# Patient Record
Sex: Female | Born: 1980 | Race: White | Hispanic: No | Marital: Married | Smoking: Never smoker
Health system: Southern US, Community
[De-identification: ages and names within clinical notes are randomized; demographics above are authoritative.]

## PROBLEM LIST (undated history)

## (undated) DIAGNOSIS — IMO0002 Reserved for concepts with insufficient information to code with codable children: Secondary | ICD-10-CM

## (undated) DIAGNOSIS — Z8742 Personal history of other diseases of the female genital tract: Secondary | ICD-10-CM

## (undated) DIAGNOSIS — B977 Papillomavirus as the cause of diseases classified elsewhere: Secondary | ICD-10-CM

## (undated) DIAGNOSIS — R87619 Unspecified abnormal cytological findings in specimens from cervix uteri: Secondary | ICD-10-CM

## (undated) DIAGNOSIS — Z86018 Personal history of other benign neoplasm: Secondary | ICD-10-CM

## (undated) DIAGNOSIS — Z86718 Personal history of other venous thrombosis and embolism: Secondary | ICD-10-CM

## (undated) DIAGNOSIS — I8390 Asymptomatic varicose veins of unspecified lower extremity: Secondary | ICD-10-CM

## (undated) HISTORY — DX: Asymptomatic varicose veins of unspecified lower extremity: I83.90

## (undated) HISTORY — PX: BUNIONECTOMY: SHX129

## (undated) HISTORY — DX: Unspecified abnormal cytological findings in specimens from cervix uteri: R87.619

## (undated) HISTORY — DX: Reserved for concepts with insufficient information to code with codable children: IMO0002

## (undated) HISTORY — PX: OTHER SURGICAL HISTORY: SHX169

## (undated) HISTORY — PX: VARICOSE VEIN SURGERY: SHX832

## (undated) HISTORY — DX: Personal history of other venous thrombosis and embolism: Z86.718

## (undated) HISTORY — DX: Personal history of other benign neoplasm: Z86.018

## (undated) HISTORY — DX: Personal history of other diseases of the female genital tract: Z87.42

## (undated) HISTORY — DX: Papillomavirus as the cause of diseases classified elsewhere: B97.7

---

## 2012-10-15 ENCOUNTER — Ambulatory Visit: Payer: BC Managed Care – PPO | Admitting: Physician Assistant

## 2012-10-15 ENCOUNTER — Encounter: Payer: Self-pay | Admitting: Physician Assistant

## 2012-10-15 VITALS — BP 124/79 | HR 76 | Ht 69.0 in | Wt 257.0 lb

## 2012-10-15 DIAGNOSIS — R635 Abnormal weight gain: Secondary | ICD-10-CM

## 2012-10-15 DIAGNOSIS — L680 Hirsutism: Secondary | ICD-10-CM

## 2012-10-15 DIAGNOSIS — N926 Irregular menstruation, unspecified: Secondary | ICD-10-CM

## 2012-10-15 DIAGNOSIS — N921 Excessive and frequent menstruation with irregular cycle: Secondary | ICD-10-CM

## 2012-10-15 DIAGNOSIS — L709 Acne, unspecified: Secondary | ICD-10-CM

## 2012-10-15 MED ORDER — ADAPALENE-BENZOYL PEROXIDE 0.1-2.5 % EX GEL: CUTANEOUS | Status: DC

## 2012-10-15 MED ORDER — LORCASERIN HCL 10 MG PO TABS: 10.0000 mg | ORAL_TABLET | Freq: Two times a day (BID) | ORAL | Status: DC

## 2012-10-15 NOTE — Progress Notes (Signed)
  Subjective:    Patient ID: Melanie Perez, female    DOB: [birth year], 32 y.o.   MRN: [id number]  HPI Patient is a 32 yo female who presents to the clinic to establish care. She is very frustrated with how she feels and her current health condition. She is not on any meds at this point. She is aware she needs tdap. She does have a paragard IUD. She has 3 children and done with family planning. Date of last peroid was 09/27/12. She has had battle with weight gain for many years but recently became much harder. 1 year ago she lost 45 lbs with Dr. Nikki Dom weight loss clinic but quickly gained it back. At the beginning of this year she started an intense work out program with Systems analyst and in the first 90 days gained 15lbs and next 90 days 10 more lbs. She has changed her diet and still gaining weight. She is very tired, no sex life, having night sweats, irregular periods. Per pt she feels like she is going through menopause. She bleeds in between periods. She has facial hair that she waxes off. Last pap was 2012 and normal. She also has acne that is cystic around chin and jaw line. Washes face twice a day.    Review of Systems  All other systems reviewed and are negative.       Objective:   Physical Exam  Constitutional: She is oriented to person, place, and time. She appears well-developed and well-nourished.  Obese.  HENT:  Head: Normocephalic and atraumatic.  Neck: Normal range of motion. Neck supple. No thyromegaly present.  Cardiovascular: Normal rate and normal heart sounds.   Pulmonary/Chest: Effort normal and breath sounds normal. She has no wheezes.  Neurological: She is alert and oriented to person, place, and time.  Skin:  Old acne scarring around chin and jaw line. Make up caked on.   NO hair on face visible.  Psychiatric: She has a normal mood and affect. Her behavior is normal.          Assessment & Plan:  Irregular periods/weight gain/hirsuitism/acne- Sounds like PCOS.  Gave handout and order labs to rule in or out pcos. Will get pelvic ultrasound. For weight issues started belviq and to continue diet and exercise changes. She certainly could be gaining some muscle but should be still losing weight. For acne started epiduo along with washing face and good makeup. Discussed oral BC for period regulation. Pt declined today. Follow up in 2 months.

## 2012-10-15 NOTE — Patient Instructions (Addendum)
Polycystic Ovarian Syndrome  Polycystic ovarian syndrome is a condition with a number of problems. One problem is with the ovaries. The ovaries are organs located in the female pelvis, on each side of the uterus. Usually, during the menstrual cycle, an egg is released from 1 ovary every month. This is called ovulation. When the egg is fertilized, it goes into the womb (uterus), which allows for the growth of a baby. The egg travels from the ovary through the fallopian tube to the uterus. The ovaries also make the hormones estrogen and progesterone. These hormones help the development of a woman's breasts, body shape, and body hair. They also regulate the menstrual cycle and pregnancy.  Sometimes, cysts form in the ovaries. A cyst is a fluid-filled sac. On the ovary, different types of cysts can form. The most common type of ovarian cyst is called a functional or ovulation cyst. It is normal, and often forms during the normal menstrual cycle. Each month, a woman's ovaries grow tiny cysts that hold the eggs. When an egg is fully grown, the sac breaks open. This releases the egg. Then, the sac which released the egg from the ovary dissolves. In one type of functional cyst, called a follicle cyst, the sac does not break open to release the egg. It may actually continue to grow. This type of cyst usually disappears within 1 to 3 months.   One type of cyst problem with the ovaries is called Polycystic Ovarian Syndrome (PCOS). In this condition, many follicle cysts form, but do not rupture and produce an egg. This health problem can affect the following:  · Menstrual cycle.  · Heart.  · Obesity.  · Cancer of the uterus.  · Fertility.  · Blood vessels.  · Hair growth (face and body) or baldness.  · Hormones.  · Appearance.  · High blood pressure.  · Stroke.  · Insulin production.  · Inflammation of the liver.  · Elevated blood cholesterol and triglycerides.  CAUSES   · No one knows the exact cause of PCOS.  · Women with  PCOS often have a mother or sister with PCOS. There is not yet enough proof to say this is inherited.  · Many women with PCOS have a weight problem.  · Researchers are looking at the relationship between PCOS and the body's ability to make insulin. Insulin is a hormone that regulates the change of sugar, starches, and other food into energy for the body's use, or for storage. Some women with PCOS make too much insulin. It is possible that the ovaries react by making too many female hormones, called androgens. This can lead to acne, excessive hair growth, weight gain, and ovulation problems.  · Too much production of luteinizing hormone (LH) from the pituitary gland in the brain stimulates the ovary to produce too much female hormone (androgen).  SYMPTOMS   · Infrequent or no menstrual periods, and/or irregular bleeding.  · Inability to get pregnant (infertility), because of not ovulating.  · Increased growth of hair on the face, chest, stomach, back, thumbs, thighs, or toes.  · Acne, oily skin, or dandruff.  · Pelvic pain.  · Weight gain or obesity, usually carrying extra weight around the waist.  · Type 2 diabetes (this is the diabetes that usually does not need insulin).  · High cholesterol.  · High blood pressure.  · Female-pattern baldness or thinning hair.  · Patches of thickened and dark brown or black skin on the neck, arms, breasts,   or thighs.  · Skin tags, or tiny excess flaps of skin, in the armpits or neck area.  · Sleep apnea (excessive snoring and breathing stops at times while asleep).  · Deepening of the voice.  · Gestational diabetes when pregnant.  · Increased risk of miscarriage with pregnancy.  DIAGNOSIS   There is no single test to diagnose PCOS.   · Your caregiver will:  · Take a medical history.  · Perform a pelvic exam.  · Perform an ultrasound.  · Check your female and female hormone levels.  · Measure glucose or sugar levels in the blood.  · Do other blood tests.  · If you are producing too many  female hormones, your caregiver will make sure it is from PCOS. At the physical exam, your caregiver will want to evaluate the areas of increased hair growth. Try to allow natural hair growth for a few days before the visit.  · During a pelvic exam, the ovaries may be enlarged or swollen by the increased number of small cysts. This can be seen more easily by vaginal ultrasound or screening, to examine the ovaries and lining of the uterus (endometrium) for cysts. The uterine lining may become thicker, if there has not been a regular period.  TREATMENT   Because there is no cure for PCOS, it needs to be managed to prevent problems. Treatments are based on your symptoms. Treatment is also based on whether you want to have a baby or whether you need contraception.   Treatment may include:  · Progesterone hormone, to start a menstrual period.  · Birth control pills, to make you have regular menstrual periods.  · Medicines to make you ovulate, if you want to get pregnant.  · Medicines to control your insulin.  · Medicine to control your blood pressure.  · Medicine and diet, to control your high cholesterol and triglycerides in your blood.  · Surgery, making small holes in the ovary, to decrease the amount of female hormone production. This is done through a long, lighted tube (laparoscope), placed into the pelvis through a tiny incision in the lower abdomen.  Your caregiver will go over some of the choices with you.  WOMEN WITH PCOS HAVE THESE CHARACTERISTICS:  · High levels of female hormones called androgens.  · An irregular or no menstrual cycle.  · May have many small cysts in their ovaries.  PCOS is the most common hormonal reproductive problem in women of childbearing age.  WHY DO WOMEN WITH PCOS HAVE TROUBLE WITH THEIR MENSTRUAL CYCLE?  Each month, about 20 eggs start to mature in the ovaries. As one egg grows and matures, the follicle breaks open to release the egg, so it can travel through the fallopian tube for  fertilization. When the single egg leaves the follicle, ovulation takes place. In women with PCOS, the ovary does not make all of the hormones it needs for any of the eggs to fully mature. They may start to grow and accumulate fluid, but no one egg becomes large enough. Instead, some may remain as cysts. Since no egg matures or is released, ovulation does not occur and the hormone progesterone is not made. Without progesterone, a woman's menstrual cycle is irregular or absent. Also, the cysts produce female hormones, which continue to prevent ovulation.   Document Released: 07/22/2004 Document Revised: 06/20/2011 Document Reviewed: 02/13/2009  ExitCare® Patient Information ©2014 [employer].

## 2012-10-19 LAB — TSH: TSH: 1.664 u[IU]/mL (ref 0.350–4.500)

## 2012-10-19 LAB — FOLLICLE STIMULATING HORMONE: FSH: 1.8 m[IU]/mL

## 2012-10-20 LAB — GLUCOSE TOLERANCE, 2 HOURS: Glucose, Fasting: 79 mg/dL (ref 70–99)

## 2012-10-22 LAB — TESTOSTERONE, FREE, TOTAL, SHBG
Sex Hormone Binding: 29 nmol/L (ref 18–114)
Testosterone: 67 ng/dL (ref 10–70)

## 2012-10-25 ENCOUNTER — Other Ambulatory Visit: Payer: Self-pay | Admitting: Physician Assistant

## 2012-10-25 ENCOUNTER — Telehealth: Payer: Self-pay | Admitting: *Deleted

## 2012-10-25 DIAGNOSIS — L680 Hirsutism: Secondary | ICD-10-CM

## 2012-10-25 DIAGNOSIS — R635 Abnormal weight gain: Secondary | ICD-10-CM

## 2012-10-25 DIAGNOSIS — N926 Irregular menstruation, unspecified: Secondary | ICD-10-CM

## 2012-10-25 DIAGNOSIS — N921 Excessive and frequent menstruation with irregular cycle: Secondary | ICD-10-CM

## 2012-10-25 DIAGNOSIS — L709 Acne, unspecified: Secondary | ICD-10-CM

## 2012-10-25 NOTE — Telephone Encounter (Signed)
Prior auth approved for Belviq through [facility] until 01/24/13. Pharm notified.

## 2012-11-06 ENCOUNTER — Ambulatory Visit (INDEPENDENT_AMBULATORY_CARE_PROVIDER_SITE_OTHER): Payer: BC Managed Care – PPO

## 2012-11-06 DIAGNOSIS — D250 Submucous leiomyoma of uterus: Secondary | ICD-10-CM

## 2012-11-06 DIAGNOSIS — R635 Abnormal weight gain: Secondary | ICD-10-CM

## 2012-11-06 DIAGNOSIS — N926 Irregular menstruation, unspecified: Secondary | ICD-10-CM

## 2012-11-06 DIAGNOSIS — L680 Hirsutism: Secondary | ICD-10-CM

## 2012-11-06 DIAGNOSIS — L709 Acne, unspecified: Secondary | ICD-10-CM

## 2012-11-06 DIAGNOSIS — N921 Excessive and frequent menstruation with irregular cycle: Secondary | ICD-10-CM

## 2012-11-06 DIAGNOSIS — D259 Leiomyoma of uterus, unspecified: Secondary | ICD-10-CM

## 2012-11-07 ENCOUNTER — Encounter: Payer: Self-pay | Admitting: Physician Assistant

## 2012-11-07 ENCOUNTER — Other Ambulatory Visit: Payer: Self-pay | Admitting: Physician Assistant

## 2012-11-07 DIAGNOSIS — D259 Leiomyoma of uterus, unspecified: Secondary | ICD-10-CM

## 2012-11-07 DIAGNOSIS — E282 Polycystic ovarian syndrome: Secondary | ICD-10-CM

## 2012-11-27 ENCOUNTER — Encounter: Payer: Self-pay | Admitting: Obstetrics & Gynecology

## 2012-11-27 ENCOUNTER — Ambulatory Visit (INDEPENDENT_AMBULATORY_CARE_PROVIDER_SITE_OTHER): Payer: BC Managed Care – PPO | Admitting: Obstetrics & Gynecology

## 2012-11-27 VITALS — BP 115/80 | HR 71 | Resp 17 | Ht 68.0 in | Wt 253.0 lb

## 2012-11-27 DIAGNOSIS — R635 Abnormal weight gain: Secondary | ICD-10-CM

## 2012-11-27 DIAGNOSIS — D259 Leiomyoma of uterus, unspecified: Secondary | ICD-10-CM

## 2012-11-27 DIAGNOSIS — Z0000 Encounter for general adult medical examination without abnormal findings: Secondary | ICD-10-CM

## 2012-11-27 DIAGNOSIS — E282 Polycystic ovarian syndrome: Secondary | ICD-10-CM

## 2012-11-27 DIAGNOSIS — L680 Hirsutism: Secondary | ICD-10-CM

## 2012-11-27 NOTE — Progress Notes (Signed)
  Subjective:    Patient ID: Melanie Perez, female    DOB: Apr 23, 1980, 32 y.o.   MRN: [id number]  HPI  32 yo M P2 ( 11 and 32 yo) here because she was recently diagnosed with PCOS and fibroids by her Primary Care Briscoe Deutscher. Her major concern is her symptoms of acne, hirsuitism, weight gain. She was recently started on a weight loss drug and has lost alomost 10 # in 1 month. Her periods with the Copper T (placed in 2008) generallly last 3 days per month, but they are "sporadic", although she does have a period every month.   Review of Systems  She reports that her last pap smear was about 3 years ago and "normal". She works as a Social research officer, government at [employer] She has been married for 5 years.    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

## 2012-11-29 ENCOUNTER — Other Ambulatory Visit: Payer: BC Managed Care – PPO | Admitting: *Deleted

## 2012-11-29 LAB — CBC
HCT: 43.3 % (ref 36.0–46.0)
MCH: 28.8 pg (ref 26.0–34.0)
MCV: 85.9 fL (ref 78.0–100.0)
RBC: 5.04 MIL/uL (ref 3.87–5.11)
RDW: 14.7 % (ref 11.5–15.5)
WBC: 8 10*3/uL (ref 4.0–10.5)

## 2012-11-29 LAB — LIPID PANEL
Cholesterol: 205 mg/dL — ABNORMAL HIGH (ref 0–200)
HDL: 40 mg/dL (ref >39)
Total CHOL/HDL Ratio: 5.1 Ratio

## 2012-11-29 LAB — COMPREHENSIVE METABOLIC PANEL
BUN: 14 mg/dL (ref 6–23)
CO2: 28 mEq/L (ref 19–32)
Calcium: 9.8 mg/dL (ref 8.4–10.5)
Chloride: 101 mEq/L (ref 96–112)
Creat: 0.64 mg/dL (ref 0.50–1.10)

## 2012-11-29 NOTE — Addendum Note (Signed)
Addended by: Arne Cleveland on: 11/29/2012 09:11 AM   Modules accepted: Orders

## 2012-11-29 NOTE — Progress Notes (Signed)
Pt here for fasting labs - Labs were ordered on last visit and pt will go to [facility] to have them drawn/resulted.

## 2012-12-03 ENCOUNTER — Telehealth: Payer: Self-pay | Admitting: *Deleted

## 2012-12-03 NOTE — Telephone Encounter (Signed)
Copy of labs mailed to pt's home address. 

## 2012-12-04 ENCOUNTER — Telehealth: Payer: Self-pay | Admitting: *Deleted

## 2012-12-04 DIAGNOSIS — L680 Hirsutism: Secondary | ICD-10-CM

## 2012-12-04 MED ORDER — SPIRONOLACTONE 50 MG PO TABS: ORAL_TABLET | ORAL | Status: DC

## 2012-12-04 NOTE — Telephone Encounter (Signed)
Pt called and stated that her RX for Spironolactone was not sent to the pharmacy.  Per Dr Ellin Saba note she did prescribe but it was not sent so therefore,I went ahead and sent it to the pharmacy

## 2012-12-21 ENCOUNTER — Other Ambulatory Visit: Payer: Self-pay | Admitting: Physician Assistant

## 2012-12-25 ENCOUNTER — Encounter: Payer: Self-pay | Admitting: Obstetrics & Gynecology

## 2012-12-25 ENCOUNTER — Ambulatory Visit (INDEPENDENT_AMBULATORY_CARE_PROVIDER_SITE_OTHER): Payer: BC Managed Care – PPO | Admitting: Obstetrics & Gynecology

## 2012-12-25 VITALS — BP 110/74 | HR 70 | Resp 16 | Ht 68.0 in | Wt 251.0 lb

## 2012-12-25 DIAGNOSIS — Z0000 Encounter for general adult medical examination without abnormal findings: Secondary | ICD-10-CM

## 2012-12-25 DIAGNOSIS — Z124 Encounter for screening for malignant neoplasm of cervix: Secondary | ICD-10-CM

## 2012-12-25 DIAGNOSIS — Z1151 Encounter for screening for human papillomavirus (HPV): Secondary | ICD-10-CM

## 2012-12-25 DIAGNOSIS — Z01419 Encounter for gynecological examination (general) (routine) without abnormal findings: Secondary | ICD-10-CM

## 2012-12-25 NOTE — Patient Instructions (Signed)
Calorie Counting Diet A calorie counting diet requires you to eat the number of calories that are right for you in a day. Calories are the measurement of how much energy you get from the food you eat. Eating the right amount of calories is important for staying at a healthy weight. If you eat too many calories, your body will store them as fat and you may gain weight. If you eat too few calories, you may lose weight. Counting the number of calories you eat during a day will help you know if you are eating the right amount. A Registered Dietitian can determine how many calories you need in a day. The amount of calories needed varies from person to person. If your goal is to lose weight, you will need to eat fewer calories. Losing weight can benefit you if you are overweight or have health problems such as heart disease, high blood pressure, or diabetes. If your goal is to gain weight, you will need to eat more calories. Gaining weight may be necessary if you have a certain health problem that causes your body to need more energy. TIPS Whether you are increasing or decreasing the number of calories you eat during a day, it may be hard to get used to changes in what you eat and drink. The following are tips to help you keep track of the number of calories you eat.  Measure foods at home with measuring cups. This helps you know the amount of food and number of calories you are eating.  Restaurants often serve food in amounts that are larger than 1 serving. While eating out, estimate how many servings of a food you are given. For example, a serving of cooked rice is  cup or about the size of half of a fist. Knowing serving sizes will help you be aware of how much food you are eating at restaurants.  Ask for smaller portion sizes or child-size portions at restaurants.  Plan to eat half of a meal at a restaurant. Take the rest home or share the other half with a friend.  Read the Nutrition Facts panel on  food labels for calorie content and serving size. You can find out how many servings are in a package, the size of a serving, and the number of calories each serving has.  For example, a package might contain 3 cookies. The Nutrition Facts panel on that package says that 1 serving is 1 cookie. Below that, it will say there are 3 servings in the container. The calories section of the Nutrition Facts label says there are 90 calories. This means there are 90 calories in 1 cookie (1 serving). If you eat 1 cookie you have eaten 90 calories. If you eat all 3 cookies, you have eaten 270 calories (3 servings x 90 calories = 270 calories). The list below tells you how big or small some common portion sizes are.  1 oz.........4 stacked dice.  3 oz.........Deck of cards.  1 tsp........Tip of little finger.  1 tbs........Thumb.  2 tbs........Golf ball.   cup.......Half of a fist.  1 cup........A fist. KEEP A FOOD LOG Write down every food item you eat, the amount you eat, and the number of calories in each food you eat during the day. At the end of the day, you can add up the total number of calories you have eaten. It may help to keep a list like the one below. Find out the calorie information by reading the   Nutrition Facts panel on food labels. Breakfast  Bran cereal (1 cup, 110 calories).  Fat-free milk ( cup, 45 calories). Snack  Apple (1 medium, 80 calories). Lunch  Spinach (1 cup, 20 calories).  Tomato ( medium, 20 calories).  Chicken breast strips (3 oz, 165 calories).  Shredded cheddar cheese ( cup, 110 calories).  Light Italian dressing (2 tbs, 60 calories).  Whole-wheat bread (1 slice, 80 calories).  Tub margarine (1 tsp, 35 calories).  Vegetable soup (1 cup, 160 calories). Dinner  Pork chop (3 oz, 190 calories).  Brown rice (1 cup, 215 calories).  Steamed broccoli ( cup, 20 calories).  Strawberries (1  cup, 65 calories).  Whipped cream (1 tbs, 50  calories). Daily Calorie Total: 1425 Document Released: 03/28/2005 Document Revised: 06/20/2011 Document Reviewed: 09/22/2006 ExitCare Patient Information 2014 [employer].  

## 2012-12-25 NOTE — Progress Notes (Signed)
Subjective:    Melanie Perez is a 32 y.o. female who presents for an annual exam. The patient has no complaints today. The patient is sexually active. GYN screening history: last pap: was normal. The patient wears seatbelts: yes. The patient participates in regular exercise: yes. Has the patient ever been transfused or tattooed?: yes. The patient reports that there is not domestic violence in her life.   Menstrual History: OB History   Grav Para Term Preterm Abortions TAB SAB Ect Mult Living   3 2 2  1 1    2       Menarche age: 36 Coitarche: 4   Patient's last menstrual period was 12/21/2012.    The following portions of the patient's history were reviewed and updated as appropriate: allergies, current medications, past family history, past medical history, past social history, past surgical history and problem list.  Review of Systems A comprehensive review of systems was negative. Married for 5 years, monogamous for 10 years, works at [employer], got her flu vaccine this season. Lab work up to date. Sees PA Briscoe Deutscher here.   Objective:    BP 110/74  Pulse 70  Resp 16  Ht 5\' 8"  (1.727 m)  Wt 251 lb (113.853 kg)  BMI 38.17 kg/m2  LMP 12/21/2012  General Appearance:    Alert, cooperative, no distress, appears stated age  Head:    Normocephalic, without obvious abnormality, atraumatic  Eyes:    PERRL, conjunctiva/corneas clear, EOM's intact, fundi    benign, both eyes  Ears:    Normal TM's and external ear canals, both ears  Nose:   Nares normal, septum midline, mucosa normal, no drainage    or sinus tenderness  Throat:   Lips, mucosa, and tongue normal; teeth and gums normal  Neck:   Supple, symmetrical, trachea midline, no adenopathy;    thyroid:  no enlargement/tenderness/nodules; no carotid   bruit or JVD  Back:     Symmetric, no curvature, ROM normal, no CVA tenderness  Lungs:     Clear to auscultation bilaterally, respirations unlabored  Chest Wall:    No tenderness  or deformity   Heart:    Regular rate and rhythm, S1 and S2 normal, no murmur, rub   or gallop  Breast Exam:    No tenderness, masses, or nipple abnormality  Abdomen:     Soft, non-tender, bowel sounds active all four quadrants,    no masses, no organomegaly  Genitalia:    Normal female without lesion, discharge or tenderness, string not seen, ULN size, NT, normal adnexal exam     Extremities:   Extremities normal, atraumatic, no cyanosis or edema  Pulses:   2+ and symmetric all extremities  Skin:   Skin color, texture, turgor normal, no rashes or lesions  Lymph nodes:   Cervical, supraclavicular, and axillary nodes normal  Neurologic:   CNII-XII intact, normal strength, sensation and reflexes    throughout  . Bedside u/s shows IUD in correct position in the uterus   Assessment:    Healthy female exam.    Plan:     Breast self exam technique reviewed and patient encouraged to perform self-exam monthly. Thin prep Pap smear. with cotesting

## 2013-02-11 ENCOUNTER — Ambulatory Visit (INDEPENDENT_AMBULATORY_CARE_PROVIDER_SITE_OTHER): Payer: BC Managed Care – PPO | Admitting: Physician Assistant

## 2013-02-11 ENCOUNTER — Encounter: Payer: Self-pay | Admitting: Physician Assistant

## 2013-02-11 VITALS — BP 111/73 | HR 70 | Wt 251.0 lb

## 2013-02-11 DIAGNOSIS — R5381 Other malaise: Secondary | ICD-10-CM

## 2013-02-11 DIAGNOSIS — D219 Benign neoplasm of connective and other soft tissue, unspecified: Secondary | ICD-10-CM | POA: Insufficient documentation

## 2013-02-11 DIAGNOSIS — D259 Leiomyoma of uterus, unspecified: Secondary | ICD-10-CM

## 2013-02-11 DIAGNOSIS — E282 Polycystic ovarian syndrome: Secondary | ICD-10-CM

## 2013-02-11 DIAGNOSIS — R635 Abnormal weight gain: Secondary | ICD-10-CM

## 2013-02-11 DIAGNOSIS — R5383 Other fatigue: Secondary | ICD-10-CM

## 2013-02-11 MED ORDER — PHENTERMINE HCL 37.5 MG PO CAPS: 37.5000 mg | ORAL_CAPSULE | ORAL | Status: DC

## 2013-02-11 NOTE — Patient Instructions (Signed)
Goal exercise 3-4 times a week. Cardio mostly. Consider interval training.  Stop belviq. Start phentermine for 1 month.  Calorie counting 1200-1500.

## 2013-02-11 NOTE — Progress Notes (Signed)
  Subjective:    Patient ID: Melanie Perez, female    DOB: 1980/05/10, 32 y.o.   MRN: [id number]  HPI Patient is a 32 year old female who presents to the clinic to followup on PCOS and weight gain. Patient reports that she has felt fatigued since starting Belviq. She saw Dr. Marice Potter, OBGYN, and was started on spirolactone for PCOS symptoms. She began to get even more fatigued. She stopped spirolactone and fatigue got better but still present. She is not exercising. She does feel like her appetite is controlled. Dr. Marice Potter does not want to remove fibroid and due to varicose veins she does not feel comfortable with pt on OCP's.    Review of Systems     Objective:   Physical Exam  Constitutional: She is oriented to person, place, and time. She appears well-developed and well-nourished.  HENT:  Head: Normocephalic and atraumatic.  Eyes: Conjunctivae are normal.  Neck: Normal range of motion. Neck supple. No thyromegaly present.  Cardiovascular: Normal rate, regular rhythm and normal heart sounds.   Pulmonary/Chest: Effort normal and breath sounds normal. She has no wheezes.  Neurological: She is alert and oriented to person, place, and time.  Skin: Skin is warm and dry.  Psychiatric: She has a normal mood and affect. Her behavior is normal.          Assessment & Plan:  PCOS/weight gain/fibroids/fatigue- stop belviq. Start phentermine. Side effects of phentermine such as insomnia and palpitations discussed. Continue on spirolactone. Goal is to control symptoms of PCOS and to work on weight loss. Perhaps belviq is causing the abnormal fatigue over the past couple of months. Fatigue blood work has been done and negative. Encouraged patient that she is actually still continue to lose 6 pounds since last visit. This is not a day therefore can't be considered successful. Encouraged cardio exercise at least 3-4 times a week. I would consider a food diary to start calorie counting. Goal calorie 1200-1500 a  day. Followup in one month.   Spent 30 minutes with patient greater than 50% of the visit was spent counseling patient regarding diet, exercise and weight gain.

## 2013-03-20 ENCOUNTER — Encounter: Payer: Self-pay | Admitting: Physician Assistant

## 2013-03-20 ENCOUNTER — Ambulatory Visit (INDEPENDENT_AMBULATORY_CARE_PROVIDER_SITE_OTHER): Payer: BC Managed Care – PPO | Admitting: Physician Assistant

## 2013-03-20 VITALS — BP 111/58 | HR 80 | Wt 244.0 lb

## 2013-03-20 DIAGNOSIS — Z6837 Body mass index (BMI) 37.0-37.9, adult: Secondary | ICD-10-CM | POA: Insufficient documentation

## 2013-03-20 DIAGNOSIS — L709 Acne, unspecified: Secondary | ICD-10-CM

## 2013-03-20 DIAGNOSIS — R635 Abnormal weight gain: Secondary | ICD-10-CM | POA: Insufficient documentation

## 2013-03-20 DIAGNOSIS — L708 Other acne: Secondary | ICD-10-CM

## 2013-03-20 MED ORDER — PHENTERMINE HCL 37.5 MG PO CAPS: 37.5000 mg | ORAL_CAPSULE | ORAL | Status: DC

## 2013-03-20 MED ORDER — ADAPALENE-BENZOYL PEROXIDE 0.1-2.5 % EX GEL: CUTANEOUS | Status: DC

## 2013-03-20 NOTE — Patient Instructions (Signed)
Qsymia Contrave

## 2013-03-20 NOTE — Progress Notes (Signed)
   Subjective:    Patient ID: Melanie Perez, female    DOB: [birth year], 32 y.o.   MRN: [id number]  HPI Pt presents to the clinic for follow up on weight and acne.   Weight- pt has been on phentermine for 1 month. She has lost 7lbs. She denies any anxiety, palpitations, CP, insomnia. She does feel like she has less of an appetite. She is trying to exercise more. She admits to not working out 4 times a week. She is happy with her results.  Acne- well controlled with epi-duo. Patient continues to wash face with over-the-counter Cetaphil products. Needs refill.    Review of Systems     Objective:   Physical Exam  Constitutional: She is oriented to person, place, and time. She appears well-developed and well-nourished.  Obesity.  Cardiovascular: Normal rate, regular rhythm and normal heart sounds.   Pulmonary/Chest: Effort normal and breath sounds normal.  Neurological: She is alert and oriented to person, place, and time.  Skin: Skin is warm and dry.  Psychiatric: She has a normal mood and affect. Her behavior is normal.          Assessment & Plan:  Abnormal weight gain/BMI of 37-discuss with patient that goal weight would be at around 200. Continue with phentermine for another month. Discussed with her about other options for weight loss contrave and qysmia. I did give her pamphlets for her to read about. Her vitals are great and no side effects. Refilled phentermine for another month. Come in to have weight check as a nurse visit in one month Along discussion regarding situational information. Patient has been skipping meals. Instructed her that that could be working against her. Talked about keeping on the under 500 calories. Patient encouraged to step up exercising to more regular basis. Followup in 2 months with appointment with myself.  Acne-refilled epi-duo.   Spent 30 minutes with patient greater than 50% of the visit spent counseling regarding weight issues.

## 2013-04-24 ENCOUNTER — Ambulatory Visit (INDEPENDENT_AMBULATORY_CARE_PROVIDER_SITE_OTHER): Payer: BC Managed Care – PPO | Admitting: Physician Assistant

## 2013-04-24 ENCOUNTER — Encounter: Payer: Self-pay | Admitting: Physician Assistant

## 2013-04-24 ENCOUNTER — Ambulatory Visit: Payer: BC Managed Care – PPO

## 2013-04-24 VITALS — BP 118/68 | HR 101 | Wt 236.0 lb

## 2013-04-24 DIAGNOSIS — M25476 Effusion, unspecified foot: Secondary | ICD-10-CM

## 2013-04-24 DIAGNOSIS — M25472 Effusion, left ankle: Secondary | ICD-10-CM

## 2013-04-24 DIAGNOSIS — Z86718 Personal history of other venous thrombosis and embolism: Secondary | ICD-10-CM

## 2013-04-24 DIAGNOSIS — R635 Abnormal weight gain: Secondary | ICD-10-CM

## 2013-04-24 DIAGNOSIS — I8390 Asymptomatic varicose veins of unspecified lower extremity: Secondary | ICD-10-CM

## 2013-04-24 DIAGNOSIS — I872 Venous insufficiency (chronic) (peripheral): Secondary | ICD-10-CM

## 2013-04-24 DIAGNOSIS — M25473 Effusion, unspecified ankle: Secondary | ICD-10-CM

## 2013-04-24 MED ORDER — PHENTERMINE HCL 37.5 MG PO CAPS: 37.5000 mg | ORAL_CAPSULE | ORAL | Status: DC

## 2013-04-24 NOTE — Progress Notes (Signed)
   Subjective:    Patient ID: Melanie Perez, female    DOB: 18-Sep-1980, 33 y.o.   MRN: [id number]  HPI Patient presents to the clinic with left ankle pain and swelling as well as followup for phentermine.  Patient has tolerated phentermine very well. She has lost another 8 pounds this month. She denies any side effects of palpitations, insomnia or decrease anxiety. She continues to exercise regularly and watching diet. She feels great on medication.  Patient also presents with left ankle pain that started yesterday after work around 5:00. She was walking around her house after work and noticed her left ankle starting to ache. As the night progressed her ankle started to swell. She now cannot put any pressure on her left leg. She is concerned because she has history of DVT in right leg as well as varicose veins. She denies any past anti-coagulation done for DVT. She has tried heat and not noticed any difference.       Review of Systems     Objective:   Physical Exam  Constitutional: She appears well-developed and well-nourished.  HENT:  Head: Normocephalic and atraumatic.  Cardiovascular: Normal rate, regular rhythm and normal heart sounds.   Pulmonary/Chest: Effort normal and breath sounds normal. She has no wheezes.  Skin: Skin is warm.  Swollen left ankle just anterior to lateral malleolus. Soft but Warm to touch as well as painful. Negative Homans sign. Significant varicose veins surrounding swollen ankle as well as up into left leg and calf. No bruising.          Assessment & Plan:  Abnormal weight gain/obesity-phentermine was filled for one more month. Will recheck in 1 month weight and blood pressure. Discussed with patient that once reaching a plateau we'll consider pulling back on phentermine. If we feel like she needs to be on some other weight loss medicine we could consider contrave. Right now I am really proud of progress. Continue healthy living.   Left ankle  swelling/pain/varicose vein/history of DVT- will get stat ultrasound to evaluate for blood clot. It is reassuring that patient is not short of breath.  Stat ultrasound was negative for blood clots. Did show findings consistent with venous insufficiency. Right now I would treat conservatively with compression stockings daily on left ankle and up to knee. Pt reminded to elevate to help swelling. Once take pressure off ankle pain should be improved. Ibuprofen to use for pain. Pt reported to already have compression stockings. Will make referral for evaluation of vein specialist.

## 2013-05-01 ENCOUNTER — Other Ambulatory Visit: Payer: Self-pay | Admitting: Physician Assistant

## 2013-05-01 ENCOUNTER — Telehealth: Payer: Self-pay | Admitting: *Deleted

## 2013-05-01 DIAGNOSIS — Z86718 Personal history of other venous thrombosis and embolism: Secondary | ICD-10-CM

## 2013-05-01 DIAGNOSIS — I872 Venous insufficiency (chronic) (peripheral): Secondary | ICD-10-CM

## 2013-05-01 DIAGNOSIS — I8390 Asymptomatic varicose veins of unspecified lower extremity: Secondary | ICD-10-CM

## 2013-05-01 NOTE — Telephone Encounter (Signed)
Come back in tomorrow and lets check it.

## 2013-05-01 NOTE — Telephone Encounter (Signed)
Pt left message stating that she hasn't heard anything from the vein specialist. I looked in her chart & didn't see a referral.  She also stated that the lump she had on her foot is moving up her leg.

## 2013-05-01 NOTE — Telephone Encounter (Signed)
Pt coming in tomorrow at 8:15.

## 2013-05-02 ENCOUNTER — Encounter: Payer: Self-pay | Admitting: Physician Assistant

## 2013-05-02 ENCOUNTER — Ambulatory Visit (INDEPENDENT_AMBULATORY_CARE_PROVIDER_SITE_OTHER): Payer: BC Managed Care – PPO | Admitting: Physician Assistant

## 2013-05-02 VITALS — BP 106/69 | HR 101 | Wt 234.0 lb

## 2013-05-02 DIAGNOSIS — I809 Phlebitis and thrombophlebitis of unspecified site: Secondary | ICD-10-CM | POA: Insufficient documentation

## 2013-05-02 DIAGNOSIS — I8000 Phlebitis and thrombophlebitis of superficial vessels of unspecified lower extremity: Secondary | ICD-10-CM

## 2013-05-02 DIAGNOSIS — I8390 Asymptomatic varicose veins of unspecified lower extremity: Secondary | ICD-10-CM

## 2013-05-02 DIAGNOSIS — I8002 Phlebitis and thrombophlebitis of superficial vessels of left lower extremity: Secondary | ICD-10-CM | POA: Insufficient documentation

## 2013-05-02 DIAGNOSIS — I872 Venous insufficiency (chronic) (peripheral): Secondary | ICD-10-CM

## 2013-05-02 NOTE — Patient Instructions (Addendum)
Full ASA 325mg  and then cut back to baby ASA at 81mg  daily or ibuprofen 400mg  for 4 times a day.  Keep compression and elevation.   Will make appt with vein specialist.

## 2013-05-02 NOTE — Progress Notes (Addendum)
   Subjective:    Patient ID: Melanie Perez, female    DOB: [birth year], 33 y.o.   MRN: [id number]  HPI Pt is a 33 yo female who presents to the clinic to follow up on superficial swelling of left leg. Pt was seen last week with area of pain and swelling around left ankle. Venous doppler was negative for DVT. Compression stocking, elevation were started.  Denies any SOB, palpitations, sweating. Area of left leg remains warm to touch and has moved further up leg. Outside leg mid calf. Less pain to walk but still throbs by the end of the day.  Nothing else has been tried.    Review of Systems     Objective:   Physical Exam  HENT:  Head: Normocephalic and atraumatic.  Cardiovascular: Regular rhythm and normal heart sounds.   Tachycardia at 101.   Pulmonary/Chest: Effort normal and breath sounds normal. She has no wheezes.  Skin:  Left leg outside mid calf area of approximately 2 cm by 2cm of induration, redness and warmth. Painful to touch.   Bilateral varicose veins.           Assessment & Plan:  Superficial thrombophlebitis/varicose veins/venous insuffiency- appears warm to touch but no infection present. Continue with compression therapy. Added ASA or NSAID therapy. Gave pt the choice. Continue to elevate legs. Call with any sudden changes, SOB, intense pain or swelling. Referral already placed for vein specialist.  Call if not improving in next 2 weeks.

## 2013-05-09 ENCOUNTER — Other Ambulatory Visit: Payer: Self-pay | Admitting: *Deleted

## 2013-05-09 DIAGNOSIS — I83893 Varicose veins of bilateral lower extremities with other complications: Secondary | ICD-10-CM

## 2013-06-05 ENCOUNTER — Encounter: Payer: Self-pay | Admitting: Vascular Surgery

## 2013-06-06 ENCOUNTER — Encounter (HOSPITAL_COMMUNITY): Payer: BC Managed Care – PPO

## 2013-06-06 ENCOUNTER — Encounter: Payer: BC Managed Care – PPO | Admitting: Vascular Surgery

## 2013-06-07 ENCOUNTER — Encounter: Payer: Self-pay | Admitting: Vascular Surgery

## 2013-06-10 ENCOUNTER — Ambulatory Visit (HOSPITAL_COMMUNITY)
Admission: RE | Admit: 2013-06-10 | Discharge: 2013-06-10 | Disposition: A | Discharge status: Home / Self Care | Payer: BC Managed Care – PPO | Source: Ambulatory Visit | Attending: Vascular Surgery | Admitting: Vascular Surgery

## 2013-06-10 ENCOUNTER — Encounter: Payer: Self-pay | Admitting: Vascular Surgery

## 2013-06-10 ENCOUNTER — Ambulatory Visit (INDEPENDENT_AMBULATORY_CARE_PROVIDER_SITE_OTHER): Payer: Self-pay | Admitting: Vascular Surgery

## 2013-06-10 ENCOUNTER — Other Ambulatory Visit: Payer: Self-pay | Admitting: Vascular Surgery

## 2013-06-10 VITALS — BP 116/77 | HR 70 | Resp 18 | Ht 69.0 in | Wt 231.1 lb

## 2013-06-10 DIAGNOSIS — Z01810 Encounter for preprocedural cardiovascular examination: Secondary | ICD-10-CM

## 2013-06-10 DIAGNOSIS — I8000 Phlebitis and thrombophlebitis of superficial vessels of unspecified lower extremity: Secondary | ICD-10-CM

## 2013-06-10 DIAGNOSIS — I83893 Varicose veins of bilateral lower extremities with other complications: Secondary | ICD-10-CM

## 2013-06-10 NOTE — Progress Notes (Signed)
Subjective:     Patient ID: Melanie Perez, female   DOB: 05-14-1980, 33 y.o.   MRN: [id number]  HPI this 33 year old female was evaluated for severe varicose veins which are quite painful in the left leg. She has a history of bilateral varicose veins which occurred after her first pregnancy. The right leg was the worst and she had vein stripping of her right great saphenous vein with multiple stab phlebectomy performed in [place] in 2003. Since that time she had her second pregnancy and the left leg now has severe varicose veins. She does have some recurrent varicosities in the right posterior thigh and calf. She has a history of superficial thrombophlebitis in the right pretibial region but no DVT. She wears a short-leg elastic compression stockings on a chronic basis below the knees. She develops aching throbbing and burning discomfort in the legs as the day progresses left worse than right. She also developed progressive edema. She has no history stasis ulcers, bleeding, or other complications in the left leg.  Past Medical History  Diagnosis Date  . Abnormal Pap smear     coplposcopy  . HPV in female   . Varicose veins     History  Substance Use Topics  . Smoking status: Never Smoker   . Smokeless tobacco: Never Used  . Alcohol Use: No    History reviewed. No pertinent family history.  Allergies  Allergen Reactions  . Belviq [Lorcaserin Hcl]     fatigued    Current outpatient prescriptions:aspirin 325 MG EC tablet, Take 325 mg by mouth daily., Disp: , Rfl: ;  Copper IUD, by Intrauterine route., Disp: , Rfl: ;  phentermine 37.5 MG capsule, Take 1 capsule (37.5 mg total) by mouth every morning., Disp: 30 capsule, Rfl: 0;  spironolactone (ALDACTONE) 50 MG tablet, Take 1 po BID, Disp: 60 tablet, Rfl: 11 Adapalene-Benzoyl Peroxide (EPIDUO) 0.1-2.5 % gel, Apply to affected area every night., Disp: 45 g, Rfl: 3  BP 116/77  Pulse 70  Resp 18  Ht 5\' 9"  (1.753 m)  Wt 231 lb 1.6 oz  (104.826 kg)  BMI 34.11 kg/m2  Body mass index is 34.11 kg/(m^2).          Review of Systems denies chest pain, dyspnea on exertion, PND, orthopnea, hemoptysis, claudication. All symptoms are related to the history and present illness all other systems negative complete review of systems     Objective:   Physical Exam BP 116/77  Pulse 70  Resp 18  Ht 5\' 9"  (1.753 m)  Wt 231 lb 1.6 oz (104.826 kg)  BMI 34.11 kg/m2  Gen.-alert and oriented x3 in no apparent distress HEENT normal for age Lungs no rhonchi or wheezing Cardiovascular regular rhythm no murmurs carotid pulses 3+ palpable no bruits audible Abdomen soft nontender no palpable masses Musculoskeletal free of  major deformities Skin clear -no rashes Neurologic normal Lower extremities 3+ femoral and dorsalis pedis pulses palpable bilaterally with no edema in the right leg. Left leg has large bulging varicosities beginning in the distal medial thigh extending into the medial calf and into the malleolar area medially and posterior calf area. There is 1+ edema. No active ulcers noted. No hyperpigmentation noted. Right leg has varicosities which are smaller in the right posterior calf and posterior thigh area extending medially and the posterior thigh.  Today I ordered a venous duplex exam the left leg which are viewed and interpret. This gross reflux throughout the left great saphenous system from the mid calf  to the saphenofemoral junction with no DVT. There is reflux in the deep system as well and the common superficial femoral and popliteal veins.       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 Will obtain venous duplex exam right leg when patient return in 3 months to see status of superficial venous system. She is status post right great saphenous vein stripping but has recurrent varicosities in calf and thigh area posteriorly

## 2013-06-11 NOTE — Addendum Note (Signed)
Addended by: Mena Goes on: 06/11/2013 08:13 AM   Modules accepted: Orders

## 2013-06-14 ENCOUNTER — Ambulatory Visit (INDEPENDENT_AMBULATORY_CARE_PROVIDER_SITE_OTHER): Payer: BC Managed Care – PPO | Admitting: Physician Assistant

## 2013-06-14 ENCOUNTER — Encounter: Payer: Self-pay | Admitting: Physician Assistant

## 2013-06-14 VITALS — BP 111/70 | HR 77 | Wt 231.0 lb

## 2013-06-14 DIAGNOSIS — E669 Obesity, unspecified: Secondary | ICD-10-CM

## 2013-06-14 DIAGNOSIS — R635 Abnormal weight gain: Secondary | ICD-10-CM

## 2013-06-14 MED ORDER — PHENTERMINE-TOPIRAMATE ER 7.5-46 MG PO CP24: 1.0000 | ORAL_CAPSULE | Freq: Every morning | ORAL | Status: DC

## 2013-06-14 MED ORDER — PHENTERMINE-TOPIRAMATE ER 3.75-23 MG PO CP24: 1.0000 | ORAL_CAPSULE | Freq: Every morning | ORAL | Status: DC

## 2013-06-14 NOTE — Patient Instructions (Signed)
Macro-diet.

## 2013-06-16 NOTE — Progress Notes (Signed)
   Subjective:    Patient ID: Melanie Perez, female    DOB: 23-Feb-1981, 33 y.o.   MRN: [id number]  HPI Pt presents to the clinic to follow up on weight loss. She has been on phentermine with no weight loss this month. She is very frustrated with progress. Denies any insomnia, palpatiations or SE. She continues to have cravings and feel hungry. She was not able to tolerate Belviq due to nausea. She has tried wellbutrin and had no effect. She is exercise by walking a couple times a week. She is generally trying to keep a healthy diet.    Review of Systems     Objective:   Physical Exam  Constitutional: She is oriented to person, place, and time. She appears well-developed and well-nourished.  Obese.  HENT:  Head: Normocephalic and atraumatic.  Cardiovascular: Normal rate, regular rhythm and normal heart sounds.   Pulmonary/Chest: Effort normal and breath sounds normal.  Neurological: She is alert and oriented to person, place, and time.  Psychiatric: She has a normal mood and affect. Her behavior is normal.          Assessment & Plan:  Abnormal weight gain/obesity- Since not losing weight stopped phentermine. Discussed Qsymia. Will start. Gave free sample and discussed to take daily. Follow up in one month. Offered nutritionist. Pt declined today. Encouraged logging calories or looking into macro diet of balanced nutrition. Increase exercise intensity and regularity.   Spent 30 minutes with patient and greater than 50 percent of visit spent counseling pt regarding weight loss ideas.

## 2013-06-18 ENCOUNTER — Other Ambulatory Visit: Payer: Self-pay | Admitting: Physician Assistant

## 2013-06-18 ENCOUNTER — Telehealth: Payer: Self-pay | Admitting: *Deleted

## 2013-06-18 MED ORDER — BUPROPION HCL ER (XL) 150 MG PO TB24: ORAL_TABLET | ORAL | Status: DC

## 2013-06-18 NOTE — Telephone Encounter (Signed)
Ok will send 150mg  tablets to start for 2s week then increase to 2 tablets daily.

## 2013-06-18 NOTE — Telephone Encounter (Signed)
This medication is not listed on med sheet- we have never filled this

## 2013-06-18 NOTE — Telephone Encounter (Signed)
Spoke with pt about the qsymia coupon card & she said that even with the card it's still going to be expensive.  She's asking if she could try a higher dose of wellbutrin.  She states that you guys had discussed this med at her last visit.  Please advise.

## 2013-06-18 NOTE — Telephone Encounter (Signed)
Pt.notified

## 2013-07-26 ENCOUNTER — Ambulatory Visit (INDEPENDENT_AMBULATORY_CARE_PROVIDER_SITE_OTHER): Payer: BC Managed Care – PPO | Admitting: Physician Assistant

## 2013-07-26 ENCOUNTER — Encounter: Payer: Self-pay | Admitting: Physician Assistant

## 2013-07-26 VITALS — BP 110/66 | HR 92 | Ht 69.0 in | Wt 235.0 lb

## 2013-07-26 DIAGNOSIS — R109 Unspecified abdominal pain: Secondary | ICD-10-CM

## 2013-07-26 DIAGNOSIS — R319 Hematuria, unspecified: Secondary | ICD-10-CM

## 2013-07-26 DIAGNOSIS — E669 Obesity, unspecified: Secondary | ICD-10-CM

## 2013-07-26 DIAGNOSIS — N390 Urinary tract infection, site not specified: Secondary | ICD-10-CM

## 2013-07-26 DIAGNOSIS — Z6834 Body mass index (BMI) 34.0-34.9, adult: Secondary | ICD-10-CM

## 2013-07-26 LAB — POCT URINALYSIS DIPSTICK
Glucose, UA: 250
Ketones, UA: 15
Nitrite, UA: POSITIVE
Protein, UA: >=300
Spec Grav, UA: <=1.005
Urobilinogen, UA: >=8
pH, UA: 5

## 2013-07-26 MED ORDER — NITROFURANTOIN MACROCRYSTAL 100 MG PO CAPS: 100.0000 mg | ORAL_CAPSULE | Freq: Two times a day (BID) | ORAL | Status: DC

## 2013-07-26 MED ORDER — BUPROPION HCL ER (XL) 300 MG PO TB24: 300.0000 mg | ORAL_TABLET | Freq: Every day | ORAL | Status: DC

## 2013-07-26 NOTE — Patient Instructions (Signed)
Urinary Tract Infection  Urinary tract infections (UTIs) can develop anywhere along your urinary tract. Your urinary tract is your body's drainage system for removing wastes and extra water. Your urinary tract includes two kidneys, two ureters, a bladder, and a urethra. Your kidneys are a pair of bean-shaped organs. Each kidney is about the size of your fist. They are located below your ribs, one on each side of your spine.  CAUSES  Infections are caused by microbes, which are microscopic organisms, including fungi, viruses, and bacteria. These organisms are so small that they can only be seen through a microscope. Bacteria are the microbes that most commonly cause UTIs.  SYMPTOMS   Symptoms of UTIs may vary by age and gender of the patient and by the location of the infection. Symptoms in young women typically include a frequent and intense urge to urinate and a painful, burning feeling in the bladder or urethra during urination. Older women and men are more likely to be tired, shaky, and weak and have muscle aches and abdominal pain. A fever may mean the infection is in your kidneys. Other symptoms of a kidney infection include pain in your back or sides below the ribs, nausea, and vomiting.  DIAGNOSIS  To diagnose a UTI, your caregiver will ask you about your symptoms. Your caregiver also will ask to provide a urine sample. The urine sample will be tested for bacteria and white blood cells. White blood cells are made by your body to help fight infection.  TREATMENT   Typically, UTIs can be treated with medication. Because most UTIs are caused by a bacterial infection, they usually can be treated with the use of antibiotics. The choice of antibiotic and length of treatment depend on your symptoms and the type of bacteria causing your infection.  HOME CARE INSTRUCTIONS   If you were prescribed antibiotics, take them exactly as your caregiver instructs you. Finish the medication even if you feel better after you  have only taken some of the medication.   Drink enough water and fluids to keep your urine clear or pale yellow.   Avoid caffeine, tea, and carbonated beverages. They tend to irritate your bladder.   Empty your bladder often. Avoid holding urine for long periods of time.   Empty your bladder before and after sexual intercourse.   After a bowel movement, women should cleanse from front to back. Use each tissue only once.  SEEK MEDICAL CARE IF:    You have back pain.   You develop a fever.   Your symptoms do not begin to resolve within 3 days.  SEEK IMMEDIATE MEDICAL CARE IF:    You have severe back pain or lower abdominal pain.   You develop chills.   You have nausea or vomiting.   You have continued burning or discomfort with urination.  MAKE SURE YOU:    Understand these instructions.   Will watch your condition.   Will get help right away if you are not doing well or get worse.  Document Released: 01/05/2005 Document Revised: 09/27/2011 Document Reviewed: 05/06/2011  [employer] Patient Information 2014 [employer]

## 2013-07-26 NOTE — Progress Notes (Signed)
   Subjective:    Patient ID: Melanie Perez, female    DOB: 06/15/1980, 33 y.o.   MRN: [id number]  HPI Pt presents to the clinic with 3 days of hematuria, dysuria, and urinary frequency. She is taking AZO OTC around the clock with little to no benefit. Denies fever, chills, nausea, vomiting, flank pain. She has started to have some abdominal pressure.   She continues to take wellbutrin once a day for weight loss, motivation, and energy. She was taking 2 tabs and decreased to one tablet daily thinking that was how it was prescribed. She liked 2 tablets better. She feels like she is more motivated to work out. Qsymia was too expensive over 100 dollars a month. Pt continue to exercise 4 times a week and counts calories to 1200-1500 daily.   Review of Systems     Objective:   Physical Exam  Constitutional: She is oriented to person, place, and time. She appears well-developed and well-nourished.  Obese.   HENT:  Head: Normocephalic and atraumatic.  Cardiovascular: Normal rate, regular rhythm and normal heart sounds.   Pulmonary/Chest: Effort normal and breath sounds normal.  No CVA tenderness.   Abdominal: Soft. Bowel sounds are normal. There is no tenderness.  Neurological: She is alert and oriented to person, place, and time.  Skin: Skin is dry.  Psychiatric: She has a normal mood and affect. Her behavior is normal.          Assessment & Plan:  UTI- . Results for orders placed in visit on 07/26/13  POCT URINALYSIS DIPSTICK      Result Value Ref Range   Color, UA red     Clarity, UA clear     Glucose, UA 250     Bilirubin, UA moderate     Ketones, UA 15     Spec Grav, UA <=1.005     Blood, UA moderate     pH, UA 5.0     Protein, UA >=300     Urobilinogen, UA >=8.0     Nitrite, UA pos     Leukocytes, UA large (3+)     Will culture. Pt has been on AZO. Will treat with marcobid for 7 days. Call if not improving or worsening. GAVE HO for symptomatic benefit.   Obesity-  discussed with pt needs to be taking 300mg  not 150mg . Will send over 300mg  tablet once daily. Continue exercise and balanced diet. Follow up in 3 months. May need to consider investment of long term weight loss medication.

## 2013-07-29 LAB — URINE CULTURE: Colony Count: 75000

## 2013-08-28 ENCOUNTER — Encounter: Payer: Self-pay | Admitting: Physician Assistant

## 2013-08-28 ENCOUNTER — Ambulatory Visit (INDEPENDENT_AMBULATORY_CARE_PROVIDER_SITE_OTHER): Payer: BC Managed Care – PPO | Admitting: Physician Assistant

## 2013-08-28 VITALS — BP 105/66 | HR 62 | Ht 69.0 in | Wt 233.0 lb

## 2013-08-28 DIAGNOSIS — R635 Abnormal weight gain: Secondary | ICD-10-CM

## 2013-08-28 DIAGNOSIS — Z6834 Body mass index (BMI) 34.0-34.9, adult: Secondary | ICD-10-CM

## 2013-08-28 MED ORDER — PHENTERMINE-TOPIRAMATE ER 3.75-23 MG PO CP24: 1.0000 | ORAL_CAPSULE | Freq: Every morning | ORAL | Status: DC

## 2013-08-28 MED ORDER — PHENTERMINE-TOPIRAMATE ER 7.5-46 MG PO CP24: 1.0000 | ORAL_CAPSULE | Freq: Every morning | ORAL | Status: DC

## 2013-08-28 NOTE — Patient Instructions (Signed)
Will send to nutritionist.

## 2013-08-28 NOTE — Progress Notes (Signed)
   Subjective:    Patient ID: Melanie Perez, female    DOB: [birth year], 33 y.o.   MRN: [id number]  HPI Pt is a 33 yo female who presents to the clinic frustrated with not being able to lose any weight. She is on wellbutrin and does not think it is helping at all. She even think it is bringing her mood down. She has lost 2 lbs since last visit but feels like she is doing everything right and not seeing results. She stopped phentermine about 2 months ago because plateau on weight loss. She is eating clean. Chicken and fish, veggies and fruit. No soft drinks and no fast food. She is exercising at [employer] 3-4 days a week.    Review of Systems     Objective:   Physical Exam  Constitutional: She is oriented to person, place, and time. She appears well-developed and well-nourished.  HENT:  Head: Normocephalic and atraumatic.  Cardiovascular: Normal rate, regular rhythm and normal heart sounds.   Pulmonary/Chest: Effort normal and breath sounds normal.  Neurological: She is alert and oriented to person, place, and time.  Psychiatric: She has a normal mood and affect. Her behavior is normal.          Assessment & Plan:  Abnormal weight loss/obesity- discussed with pt how PcOS I feel like is definitely now helping weight loss. Due to veins she cannot take OCP. stop wellbutrin. Start qsymia. Discussed taper. Discussed SE. Continue diet and exercise. Follow up in 6 weeks. Will send to nutritionist to get on top of nutrition.

## 2013-09-09 ENCOUNTER — Encounter: Payer: Self-pay | Admitting: Vascular Surgery

## 2013-09-10 ENCOUNTER — Encounter: Payer: Self-pay | Admitting: Vascular Surgery

## 2013-09-10 ENCOUNTER — Other Ambulatory Visit: Payer: Self-pay | Admitting: Vascular Surgery

## 2013-09-10 ENCOUNTER — Ambulatory Visit (HOSPITAL_COMMUNITY)
Admission: RE | Admit: 2013-09-10 | Discharge: 2013-09-10 | Disposition: A | Discharge status: Home / Self Care | Payer: BC Managed Care – PPO | Source: Ambulatory Visit | Attending: Vascular Surgery | Admitting: Vascular Surgery

## 2013-09-10 ENCOUNTER — Ambulatory Visit (INDEPENDENT_AMBULATORY_CARE_PROVIDER_SITE_OTHER): Payer: BC Managed Care – PPO | Admitting: Vascular Surgery

## 2013-09-10 VITALS — BP 119/71 | HR 71 | Resp 16 | Ht 69.0 in | Wt 234.0 lb

## 2013-09-10 DIAGNOSIS — Z01810 Encounter for preprocedural cardiovascular examination: Secondary | ICD-10-CM

## 2013-09-10 DIAGNOSIS — I83893 Varicose veins of bilateral lower extremities with other complications: Secondary | ICD-10-CM

## 2013-09-10 NOTE — Progress Notes (Signed)
Subjective:     Patient ID: Melanie Perez, female   DOB: 1980/11/02, 33 y.o.   MRN: [id number]  HPI this 33 year old female returns for continued followup regarding her painful varicosities in the left leg. She continues to have aching throbbing and burning discomfort as well as progressive edema in the left ankle as the day progresses. 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 She has no history of DVT. She has tried wearing long elastic compression stockings 20-30 mm gradient with no improvement in her symptoms. She is unable to elevate her legs at work but has tried ibuprofen.  Past Medical History  Diagnosis Date  . Abnormal Pap smear     coplposcopy  . HPV in female   . Varicose veins     History  Substance Use Topics  . Smoking status: Never Smoker   . Smokeless tobacco: Never Used  . Alcohol Use: No    No family history on file.  Allergies  Allergen Reactions  . Belviq [Lorcaserin Hcl]     fatigued    Current outpatient prescriptions:Adapalene-Benzoyl Peroxide (EPIDUO) 0.1-2.5 % gel, Apply to affected area every night., Disp: 45 g, Rfl: 3;  aspirin 325 MG EC tablet, Take 325 mg by mouth daily., Disp: , Rfl: ;  Copper IUD, by Intrauterine route., Disp: , Rfl: ;  Phentermine-Topiramate 3.75-23 MG CP24, Take 1 tablet by mouth every morning., Disp: 14 capsule, Rfl: 0 Phentermine-Topiramate 7.5-46 MG CP24, Take 1 tablet by mouth every morning., Disp: 30 capsule, Rfl: 0;  spironolactone (ALDACTONE) 50 MG tablet, Take 1 po BID, Disp: 60 tablet, Rfl: 11  BP 119/71  Pulse 71  Resp 16  Ht 5\' 9"  (1.753 m)  Wt 234 lb (106.142 kg)  BMI 34.54 kg/m2  Body mass index is 34.54 kg/(m^2).            Review of Systems denies chest pain, dyspnea on exertion, PND, orthopnea, hemoptysis.    Objective:   Physical Exam BP 119/71  Pulse 71  Resp 16  Ht 5\' 9"  (1.753 m)  Wt 234 lb (106.142 kg)  BMI 34.54 kg/m2  General  well-developed well-nourished female no apparent stress and oriented x3 Lungs no rhonchi or wheezing Left lower extremity with large bulging varicosities in the medial thigh medial calf pretibial region and lateral ankle area with 1+ edema but no active ulceration. 3+ dorsalis pedis pulse palpable.  She has documented gross reflux throughout the left great saphenous system from the midcalf to near the saphenofemoral junction. Right leg was studied today which has a history of vein stripping in the [place] 2003. There does appear to be a great saphenous vein remaining which has reflux but independent sono site exam by me did not reveal the vein to be large enough for laser ablation at this time.      Assessment:     Painful varicosities left leg not responding to conservative measures-affecting the patient's ability to work as a Dance movement psychotherapist    Plan:     Patient needs laser ablation left great saphenous vein with greater than 20 stab phlebectomy of painful varicosities as a single procedure. We'll proceed with precertification perform this in the near future

## 2013-09-25 ENCOUNTER — Other Ambulatory Visit: Payer: Self-pay | Admitting: *Deleted

## 2013-09-25 DIAGNOSIS — I83893 Varicose veins of bilateral lower extremities with other complications: Secondary | ICD-10-CM

## 2013-10-04 ENCOUNTER — Encounter: Payer: Self-pay | Admitting: Vascular Surgery

## 2013-10-07 ENCOUNTER — Encounter: Payer: Self-pay | Admitting: Vascular Surgery

## 2013-10-07 ENCOUNTER — Ambulatory Visit (INDEPENDENT_AMBULATORY_CARE_PROVIDER_SITE_OTHER): Payer: BC Managed Care – PPO | Admitting: Vascular Surgery

## 2013-10-07 VITALS — BP 115/76 | HR 77 | Resp 16 | Ht 69.0 in | Wt 225.0 lb

## 2013-10-07 DIAGNOSIS — I83893 Varicose veins of bilateral lower extremities with other complications: Secondary | ICD-10-CM

## 2013-10-07 NOTE — Progress Notes (Signed)
   Laser Ablation Procedure      Date: 10/07/2013    Melanie Perez DOB:11/17/1980  Consent signed: Yes  Surgeon:J.D. Kellie Simmering  Procedure: Laser Ablation: left Greater Saphenous Vein  BP 115/76  Pulse 77  Resp 16  Ht 5\' 9"  (1.753 m)  Wt 225 lb (102.059 kg)  BMI 33.21 kg/m2  Start time: 10:45   End time: 12:15  Tumescent Anesthesia: 500 cc 0.9% NaCl with 50 cc Lidocaine HCL with 1% Epi and 15 cc 8.4% NaHCO3  Local Anesthesia: 11 cc Lidocaine HCL and NaHCO3 (ratio 2:1)  Pulsed mode: 15 watts, 500 ms delay, 1.0 duration Total energy: 2054, total pulses: 138, total time: 2:17     Stab Phlebectomy: >20 Sites: Thigh, Calf and Ankle  Patient tolerated procedure well: Yes  Notes:   Description of Procedure:  After marking the course of the saphenous vein and the secondary varicosities in the standing position, the patient was placed on the operating table in the supine position, and the left leg was prepped and draped in sterile fashion. Local anesthetic was administered, and under ultrasound guidance the saphenous vein was accessed with a micro needle and guide wire; then the micro puncture sheath was placed. A guide wire was inserted to the saphenofemoral junction, followed by a 5 french sheath.  The position of the sheath and then the laser fiber below the junction was confirmed using the ultrasound and visualization of the aiming beam.  Tumescent anesthesia was administered along the course of the saphenous vein using ultrasound guidance. Protective laser glasses were placed on the patient, and the laser was fired at 15 watt pulsed mode advancing 1-2 mm per sec.  For a total of 2054  joules.  A steri strip was applied to the puncture site.  The patient was then put into Trendelenburg position.  Local anesthetic was utilized overlying the marked varicosities.  Greater than 20 stab wounds were made using the tip of an 11 blade; and using the vein hook,  The phlebectomies were performed  using a hemostat to avulse these varicosities.  Adequate hemostasis was achieved, and steri strips were applied to the stab wound.     ABD pads and thigh high compression stockings were applied.  Ace wrap bandages were applied over the phlebectomy sites and at the top of the saphenofemoral junction.  Blood loss was less than 15 cc.  The patient ambulated out of the operating room having tolerated the procedure well.

## 2013-10-07 NOTE — Progress Notes (Signed)
Subjective:     Patient ID: Melanie Perez, female   DOB: 1980-04-19, 33 y.o.   MRN: [id number]  HPI this 33 year old female had laser ablation left great saphenous vein plus approximately 30 stab phlebectomy of painful varicosities all performed under local tumescent anesthesia. A total of 2050 J of energy was utilized. She tolerated the procedure well.   Review of Systems     Objective:   Physical Exam BP 115/76  Pulse 77  Resp 16  Ht 5\' 9"  (1.753 m)  Wt 225 lb (102.059 kg)  BMI 33.21 kg/m2        Assessment:     Well-tolerated laser ablation left great saphenous vein with greater than 20 stab phlebectomy painful varicosities performed under local tumescent anesthesia    Plan:     Return July 7 for venous duplex exam to confirm closure left great saphenous vein

## 2013-10-08 ENCOUNTER — Telehealth: Payer: Self-pay | Admitting: *Deleted

## 2013-10-08 NOTE — Telephone Encounter (Signed)
Pt doing well. No bleeding. Slight discomfort. Following all instructions.

## 2013-10-09 ENCOUNTER — Telehealth: Payer: Self-pay | Admitting: *Deleted

## 2013-10-09 NOTE — Telephone Encounter (Signed)
Melanie Perez is s/p 10-07-2013 endovenous laser ablation left greater saphenous vein and stab phlebectomy > 20 incisions left leg by Tinnie Gens MD.  Melanie Perez states that Tuesday(10-08-2013)  morning when she awoke and got up she became "light-headed and made it back to the bed where I passed out."  She said she ate although she states she did not eat much and was fine the rest of the day and evening yesterday (10-08-2013).  She states that when she awoke this morning (10-09-2013) " I was light-headed and passed out in the bathroom."  Spoke with Dr. Evelena Leyden nurse, Melanie Silversmith RN, who talked with Melanie Perez yesterday morning (10-08-2013) for post procedural phone call documented in EPIC and Melanie Perez made no mention of being light headed or passing out. Melanie Perez denies left foot or ankle swelling.  Melanie Perez says she has no pain or discomfort except mild pain along left inner thigh which is the site that was treated.  Melanie Perez states she is taking Ibuprofen for pain and denies other pain (narcotic) medication usage.  Encouraged Melanie Perez to call her primary care physician this morning since she reported this episode twice.  Advised her that these symptoms were not symptoms seen post laser ablation surgery. Melanie Perez verbalized understanding and states that she will call her primary care MD today.  Encouraged Melanie Perez to call [facility] for further questions or concerns.  Melanie Perez has post laser ablation duplex and follow up appointment with Melanie Perez on 10-15-2013.

## 2013-10-14 ENCOUNTER — Encounter: Payer: Self-pay | Admitting: Vascular Surgery

## 2013-10-15 ENCOUNTER — Encounter: Payer: Self-pay | Admitting: Vascular Surgery

## 2013-10-15 ENCOUNTER — Ambulatory Visit (HOSPITAL_COMMUNITY)
Admission: RE | Admit: 2013-10-15 | Discharge: 2013-10-15 | Disposition: A | Discharge status: Home / Self Care | Payer: BC Managed Care – PPO | Source: Ambulatory Visit | Attending: Vascular Surgery | Admitting: Vascular Surgery

## 2013-10-15 ENCOUNTER — Ambulatory Visit (INDEPENDENT_AMBULATORY_CARE_PROVIDER_SITE_OTHER): Payer: Self-pay | Admitting: Vascular Surgery

## 2013-10-15 VITALS — BP 123/76 | HR 80 | Resp 16 | Ht 69.0 in | Wt 225.0 lb

## 2013-10-15 DIAGNOSIS — I83893 Varicose veins of bilateral lower extremities with other complications: Secondary | ICD-10-CM

## 2013-10-15 NOTE — Progress Notes (Signed)
Subjective:     Patient ID: Melanie Perez, female   DOB: [birth year], 33 y.o.   MRN: [id number]  HPI this 33 year old female returns 1 week post laser ablation left great saphenous vein plus multiple stab phlebectomy of painful varicosities. She had quite a bit of discomfort in the mid to distal thigh where the ablation was performed. She did take ibuprofen as instructed and to use ice which didn't help significantly. The pain has now subsided significantly. She has had no change in distal edema. A stab lobectomy site had not been painful.  Review of Systems     Objective:   Physical Exam BP 123/76  Pulse 80  Resp 16  Ht 5\' 9"  (1.753 m)  Wt 225 lb (102.059 kg)  BMI 33.21 kg/m2  General well-developed well-nourished female no apparent stress alert and oriented x3 Lungs no rhonchi or wheezing Left lower extremity with minimal ecchymosis. Tendon to touch in mid to distal thigh. Stab phlebectomy sites well healed distally. 1+ edema with 3 posterior cells pedis pulse palpable.  Today I ordered lower extremity venous duplex exam plus I performed a independent bedside SonoSite ultrasound exam Patient does have total closure of the left great saphenous vein which bulges into the common femoral vein but there is no complete DVT with good flow by this area      Assessment:     Thrombus extending to saphenofemoral junction bulging in the common femoral vein following laser ablation left great saphenous vein    Plan:     We'll plan to treat patient with Xarelto for 4 weeks. The dose will be 15 mg twice a day for 3 weeks and then 20 mg daily for the last 10 days. We will then have her return for venous duplex exam. She does have gross reflux in the right great saphenous system with some painful varicosities but is not ready to proceed with a right leg in the near future

## 2013-10-24 ENCOUNTER — Encounter: Payer: Self-pay | Admitting: Physician Assistant

## 2013-10-24 ENCOUNTER — Ambulatory Visit (INDEPENDENT_AMBULATORY_CARE_PROVIDER_SITE_OTHER): Payer: BC Managed Care – PPO | Admitting: Physician Assistant

## 2013-10-24 VITALS — BP 120/71 | HR 91 | Ht 69.0 in | Wt 231.0 lb

## 2013-10-24 DIAGNOSIS — R55 Syncope and collapse: Secondary | ICD-10-CM

## 2013-10-24 DIAGNOSIS — R42 Dizziness and giddiness: Secondary | ICD-10-CM

## 2013-10-24 DIAGNOSIS — I82409 Acute embolism and thrombosis of unspecified deep veins of unspecified lower extremity: Secondary | ICD-10-CM | POA: Insufficient documentation

## 2013-10-24 DIAGNOSIS — R635 Abnormal weight gain: Secondary | ICD-10-CM

## 2013-10-24 DIAGNOSIS — I82402 Acute embolism and thrombosis of unspecified deep veins of left lower extremity: Secondary | ICD-10-CM

## 2013-10-24 MED ORDER — PHENTERMINE-TOPIRAMATE ER 7.5-46 MG PO CP24: 1.0000 | ORAL_CAPSULE | Freq: Every morning | ORAL | Status: DC

## 2013-10-24 MED ORDER — AMBULATORY NON FORMULARY MEDICATION: Status: DC

## 2013-10-24 NOTE — Progress Notes (Signed)
   Subjective:    Patient ID: Melanie Perez, female    DOB: Nov 12, 1980, 33 y.o.   MRN: [id number]  HPI patient is a 33 year old female who presents to the clinic to followup on qsymia for weight loss. She took it for almost one month and had lost 6 pounds and was feeling some progress.  She then had her varicose vein procedure on 10/07/2013. She has had numerous complications since then. She's had a lot of pain in her left leg as well as development of a blood clot. She is currently on Xaralto for the next 30 days until followup appointment with vascular surgery. She is starting to feel better but she is not released to exercise, wall or any other strenuous activity. She has stopped taking Qsymia while she recovers from surgery and DVT. She denies any side effect of medication. Patient denies any shortness of breath or worsening left leg pain or swelling. Pt was also having some dizziness after surgery and passed out twice. Before she passes out she feels really shaky. Orange juice has helped along with more frequent meals.  Today weight down only 2lbs.    Review of Systems  All other systems reviewed and are negative.      Objective:   Physical Exam  Constitutional: She is oriented to person, place, and time. She appears well-developed and well-nourished.  Obesity.   HENT:  Head: Normocephalic and atraumatic.  Cardiovascular: Normal rate, regular rhythm and normal heart sounds.   Pulmonary/Chest: Effort normal and breath sounds normal.  Neurological: She is alert and oriented to person, place, and time.  Skin: Skin is dry.  Psychiatric: She has a normal mood and affect. Her behavior is normal.          Assessment & Plan:  Abnormal weight gain/obesity- refilled qsymia discussed there is a higher dose if needs to be increased. Pt can stay on medication despite not being allow to exercise.  Gave 2 refills and follow up in 2 months. Obviously diet and exercise together will yield best  results.   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 Managed by vascular surgery.   Dizziness/syncope- could be low blood sugar or vasovagal response after vein surgery. Gave glucometer to check when starts to feel shaky. Eating frequent meals is mainstay. Pt has PCOS which makes her more likely to have elevated blood sugar and insulin resistance. Follow up in 1 month with log of sugars.

## 2013-10-28 ENCOUNTER — Encounter: Payer: Self-pay | Admitting: Vascular Surgery

## 2013-11-04 ENCOUNTER — Other Ambulatory Visit: Payer: Self-pay | Admitting: *Deleted

## 2013-11-04 DIAGNOSIS — I83893 Varicose veins of bilateral lower extremities with other complications: Secondary | ICD-10-CM

## 2013-11-18 ENCOUNTER — Encounter: Payer: Self-pay | Admitting: Vascular Surgery

## 2013-11-19 ENCOUNTER — Encounter: Payer: Self-pay | Admitting: Vascular Surgery

## 2013-11-19 ENCOUNTER — Ambulatory Visit (INDEPENDENT_AMBULATORY_CARE_PROVIDER_SITE_OTHER): Payer: Self-pay | Admitting: Vascular Surgery

## 2013-11-19 ENCOUNTER — Ambulatory Visit (HOSPITAL_COMMUNITY)
Admission: RE | Admit: 2013-11-19 | Discharge: 2013-11-19 | Disposition: A | Discharge status: Home / Self Care | Payer: BC Managed Care – PPO | Source: Ambulatory Visit | Attending: Vascular Surgery | Admitting: Vascular Surgery

## 2013-11-19 VITALS — BP 118/80 | HR 86 | Resp 16 | Ht 69.0 in | Wt 226.0 lb

## 2013-11-19 DIAGNOSIS — I83893 Varicose veins of bilateral lower extremities with other complications: Secondary | ICD-10-CM | POA: Insufficient documentation

## 2013-11-19 NOTE — Progress Notes (Signed)
Subjective:     Patient ID: Melanie Perez, female   DOB: [birth year], 32 y.o.   MRN: [id number]  HPI this 33 year old female returns for continued followup regarding her laser ablation of the left great saphenous vein with multiple stab phlebectomy performed a few months ago. She did have some bulging of thrombus into the left common femoral vein on the followup duplex scan although there was no true DVT. She was treated with Xarelto for 6 weeks. She returns today stating the left leg feels good with no distal edema. She does occasionally wear her long light elastic compression stockings but the upper portion irritates her thigh area. She has had no chest pain or shortness of breath.   Review of Systems     Objective:   Physical Exam BP 118/80  Pulse 86  Resp 16  Ht 5\' 9"  (1.753 m)  Wt 226 lb (102.513 kg)  BMI 33.36 kg/m2  General well-developed well-nourished female in no apparent stress alert and oriented x3 Left leg with no obvious edema. She does have a few residual varicosities in the medial ankle area on the left. She does have for possibly some right posterior calf and thigh area.  Today I ordered a venous duplex exam of the left leg which are reviewed and interpreted. The left great saphenous vein is totally closed and the deep system is widely patent with no evidence of any thrombus or bulging thrombus into the vein.     Assessment:     Good result following left great saphenous vein ablation and multiple stab phlebectomy Patient has gross reflux and right great saphenous vein with painful varicosities which she will treat conservatively with elastic compression stockings and elevation for the present time    Plan:     She will return to see [place] on a when necessary basis

## 2013-12-22 IMAGING — US US TRANSVAGINAL NON-OB
1 series · 13 of 25 positions shown · non-contrast
Comparison: Noncontrast CT of the abdomen and pelvis 07/05/2011

CLINICAL DATA: Heavy menses with breakthrough bleeding. Weight
gain, acne, and facial hair. LMP 10/17/2012.

TRANSABDOMINAL AND TRANSVAGINAL ULTRASOUND OF PELVIS
TECHNIQUE: Both transabdominal and transvaginal ultrasound
examinations of the pelvis were performed. Transabdominal technique
was performed for global imaging of the pelvis including uterus,
ovaries, adnexal regions, and pelvic cul-de-sac.
It was necessary to proceed with endovaginal exam following the
transabdominal exam to visualize the uterus, endometrium, ovaries,
adnexal regions been.

[Series 1: us transvaginal non-ob · 0.32mm/px · 69 acquisitions, 13 frames shown]
[im 1/69]
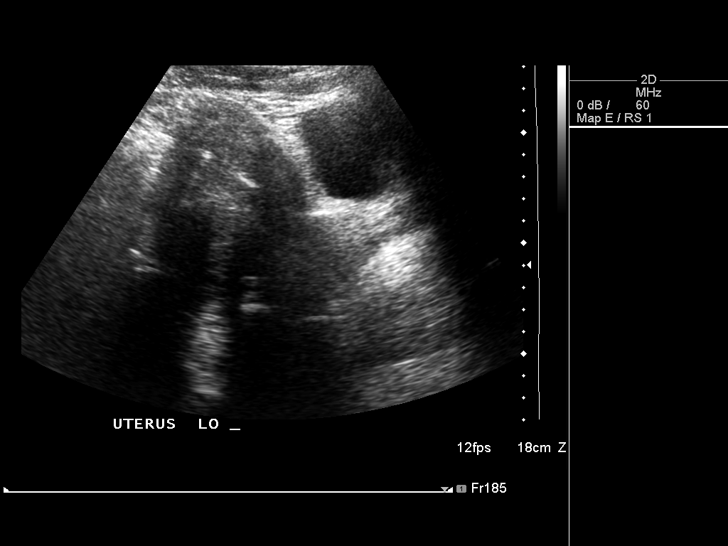
[im 6/69]
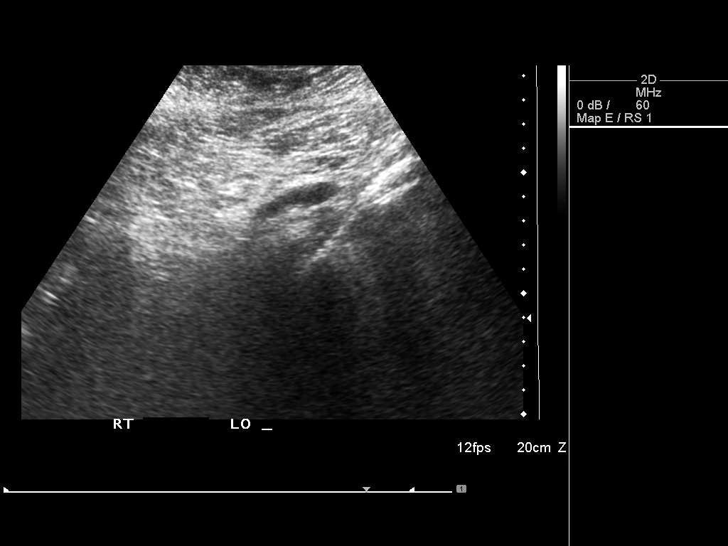
[im 12/69]
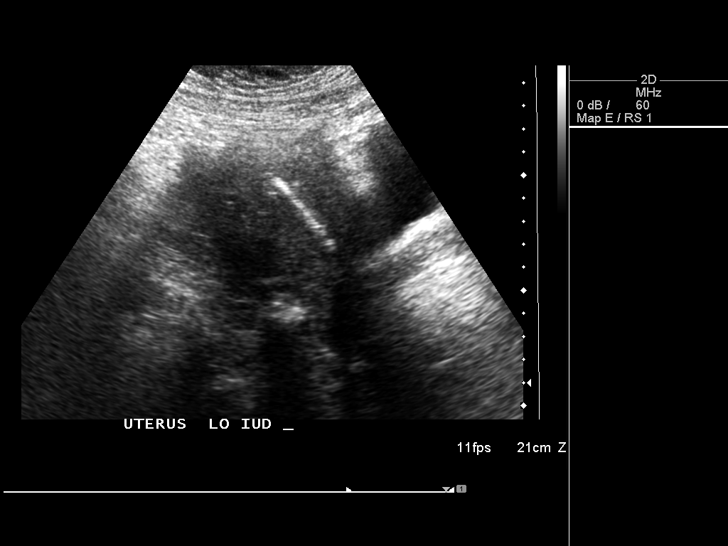
[im 18/69]
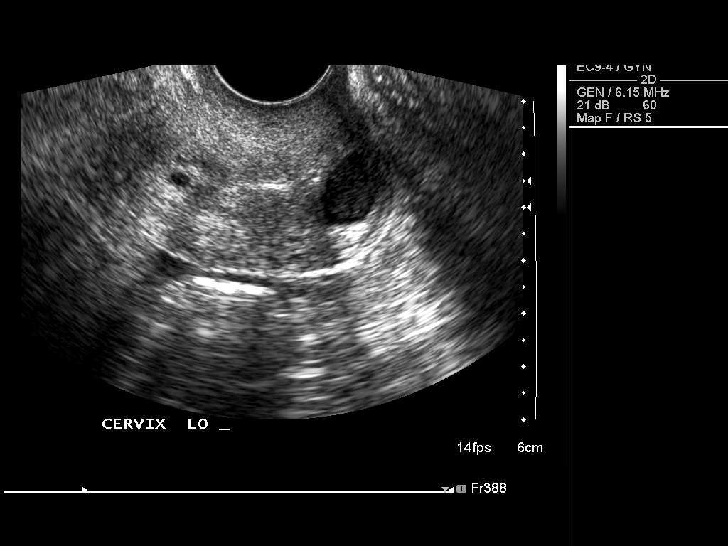
[im 23/69]
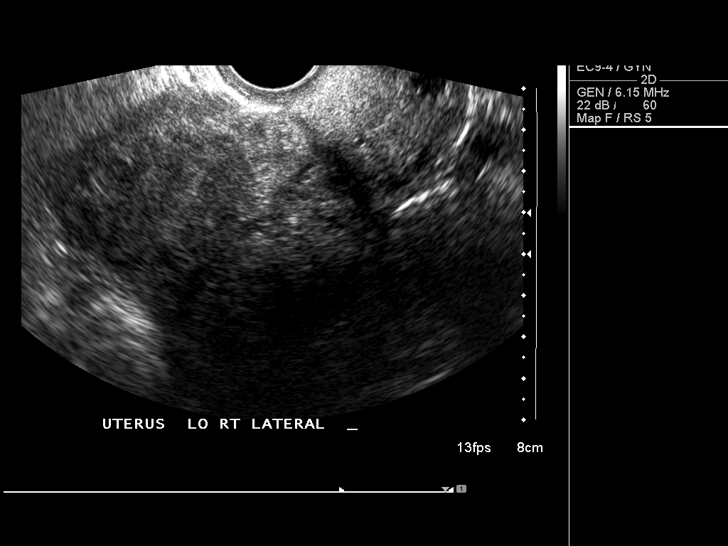
[im 29/69]
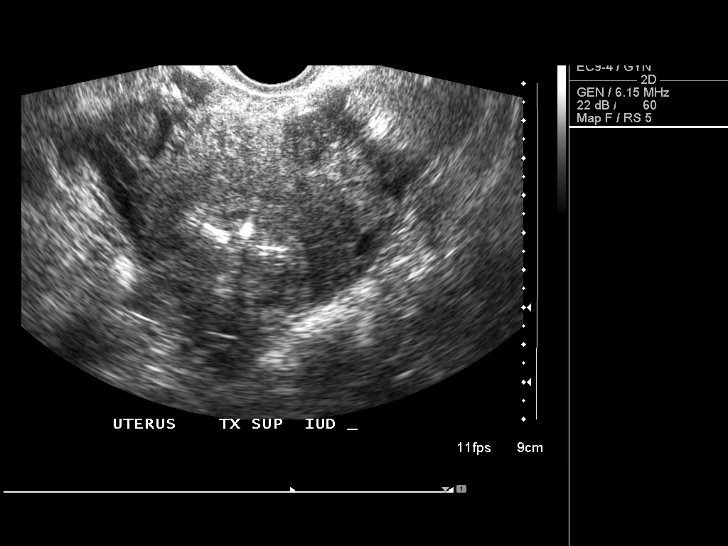
[im 35/69]
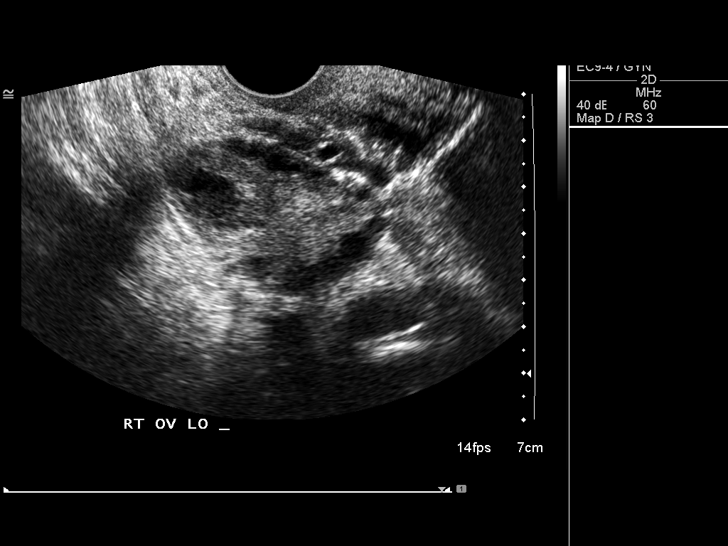
[im 40/69]
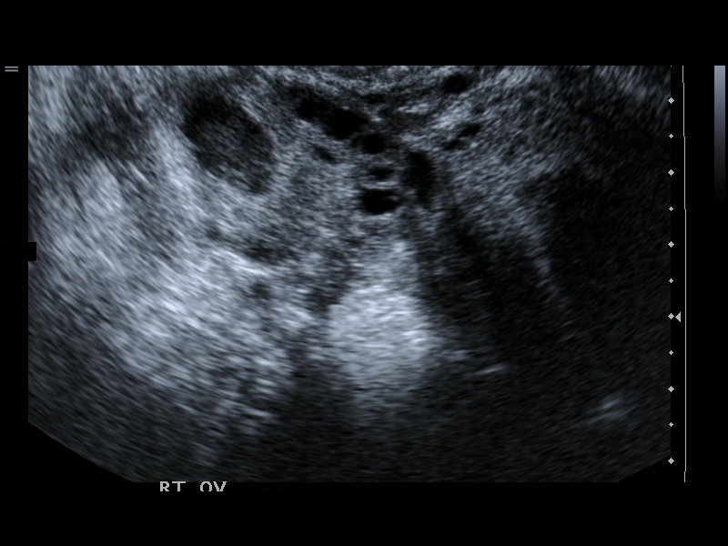
[im 46/69]
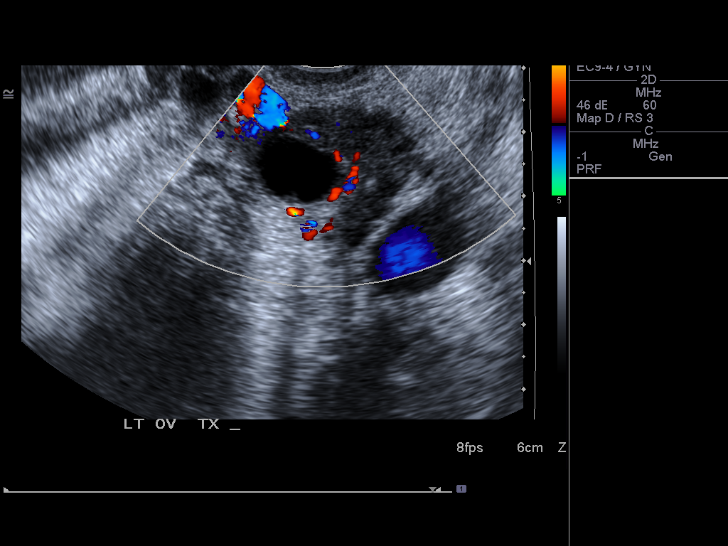
[im 52/69]
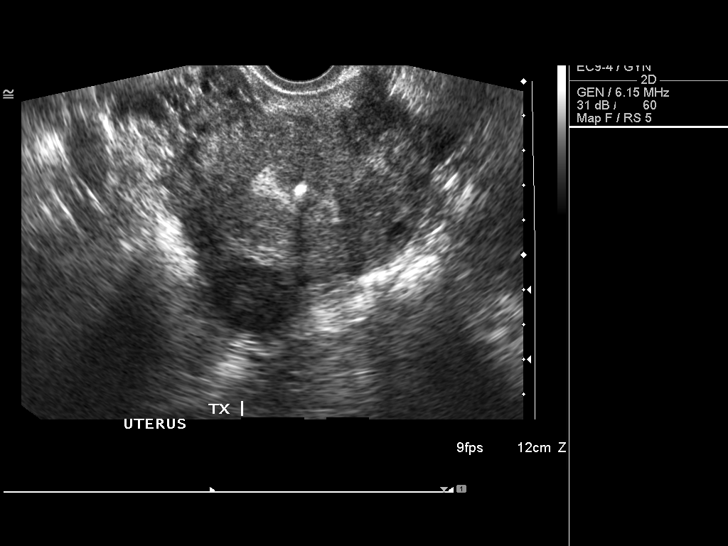
[im 57/69]
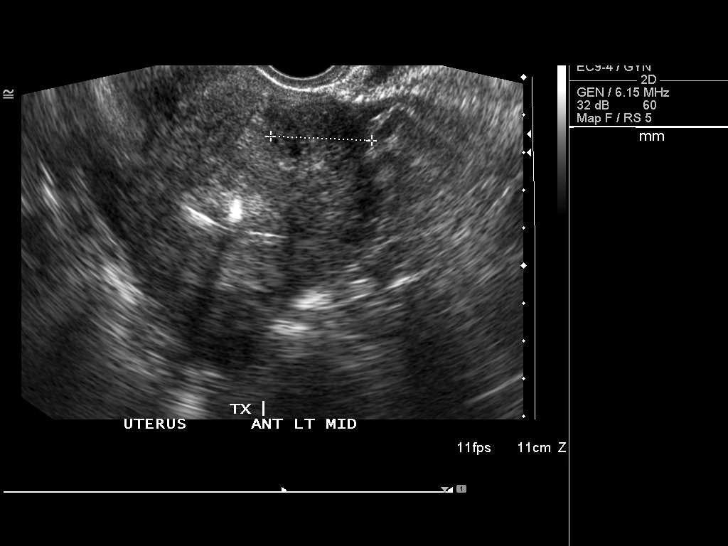
[im 63/69]
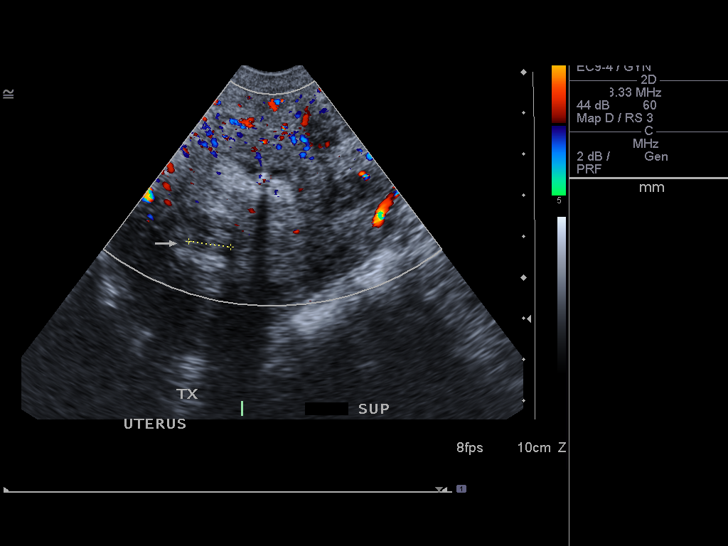
[im 69/69]
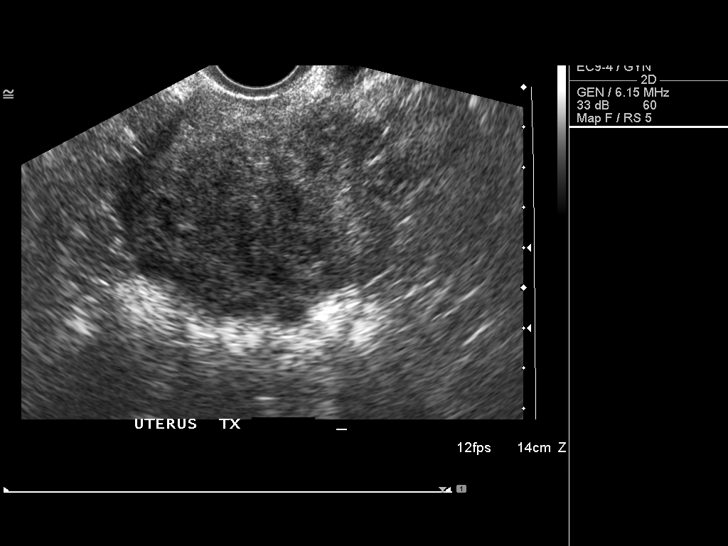

[13 of 25 positions shown; findings below may reference images not displayed]

FINDINGS: Uterus: 10.6 x 8.6 x 6.9 cm.  The entered uterus appears
heterogeneous.  Multiple fibroids are present:
Posterior fibroid is 6.3 x 4.9 x 5.0 cm.
Fundal fibroid is 2.5 x 3.3 x 2.8 cm.
Anterior fibroid is 2.6 x 1.7 x 2.7 cm.
Central fibroid is adjacent to the endometrium and measures 1.5 x
1.3 x 1.0 cm, likely submucosal in location.

Endometrium: 15.2 mm, trilaminar in appearance.  Intrauterine
device is identified in the central canal.

Right ovary:  5.0 x 3.0 x 3.0 cm.  Multiple peripherally located
small follicles are identified.

Left ovary: 3.8 x 2.7 x 2.7 cm.  Small, peripherally located
follicles are present.

Other findings: Trace free pelvic fluid.
IMPRESSION: 1. Enlarged uterus containing multiple fibroids. Small submucosal
fibroid, 1.5 cm.
2. Peripherally located follicles on both ovaries.  Consider
polycystic ovarian syndrome.
3.  No evidence for adnexal mass.

## 2014-01-06 ENCOUNTER — Telehealth: Payer: Self-pay | Admitting: *Deleted

## 2014-01-06 NOTE — Telephone Encounter (Signed)
RF request for Spironolactone declined. Pt has not seen Dr Hulan Fray since 9/14 and per the encounter notes from [facility] pt had gone off of Spironolactone.  This med was not on her current med list.  Pt must make appt with Dr Hulan Fray.

## 2014-01-10 ENCOUNTER — Encounter: Payer: Self-pay | Admitting: Physician Assistant

## 2014-01-10 ENCOUNTER — Ambulatory Visit (INDEPENDENT_AMBULATORY_CARE_PROVIDER_SITE_OTHER): Payer: BC Managed Care – PPO | Admitting: Physician Assistant

## 2014-01-10 VITALS — BP 113/66 | HR 75 | Ht 69.0 in | Wt 227.0 lb

## 2014-01-10 DIAGNOSIS — L689 Hypertrichosis, unspecified: Secondary | ICD-10-CM | POA: Diagnosis not present

## 2014-01-10 DIAGNOSIS — E669 Obesity, unspecified: Secondary | ICD-10-CM | POA: Diagnosis not present

## 2014-01-10 DIAGNOSIS — R635 Abnormal weight gain: Secondary | ICD-10-CM | POA: Diagnosis not present

## 2014-01-10 DIAGNOSIS — E282 Polycystic ovarian syndrome: Secondary | ICD-10-CM

## 2014-01-10 MED ORDER — PHENTERMINE-TOPIRAMATE ER 15-92 MG PO CP24: ORAL_CAPSULE | ORAL | Status: DC

## 2014-01-10 MED ORDER — SPIRONOLACTONE 50 MG PO TABS: 50.0000 mg | ORAL_TABLET | Freq: Two times a day (BID) | ORAL | Status: DC

## 2014-01-12 DIAGNOSIS — L689 Hypertrichosis, unspecified: Secondary | ICD-10-CM | POA: Insufficient documentation

## 2014-01-12 NOTE — Progress Notes (Signed)
   Subjective:    Patient ID: Melanie Perez, female    DOB: [birth year], 33 y.o.   MRN: [id number]  HPI Pt presents to the clinic for weight check. She has been on qsymia for 2 months. She noticed some appetitie control but no real weight loss. She has not been on for 1 month. Weight is stable no loss or gain. She is back to exercising a couple of times a week. She is still very frustrated.   Review of Systems  All other systems reviewed and are negative.      Objective:   Physical Exam  Constitutional: She is oriented to person, place, and time. She appears well-developed and well-nourished.  HENT:  Head: Normocephalic and atraumatic.  Cardiovascular: Normal rate, regular rhythm and normal heart sounds.   Pulmonary/Chest: Effort normal and breath sounds normal. She has no wheezes.  Neurological: She is alert and oriented to person, place, and time.  Skin: Skin is dry.  Psychiatric: She has a normal mood and affect. Her behavior is normal.          Assessment & Plan:  Obesity/abnormal weight gain- increased qsymia to highest dosage. Discussed going to endocrinologist to full work up. Declined at this point. Went to nutiritionist already to get some weight loss ideas. Phentermine she did not have success with this last time. Discussed muscle confusion and changing up diet. Encouraged 1300 calorie diet for weight loss. Discussed at least bariactric consult if not weight loss in next 2 months.   PCOS/hair growth- refilled spirolactone for 1 year. Doing well on it.

## 2014-01-21 ENCOUNTER — Telehealth: Payer: Self-pay | Admitting: *Deleted

## 2014-01-21 NOTE — Telephone Encounter (Signed)
Prior auth submitted to [employer] for Qysimia. Margette Fast, CMA

## 2014-02-10 ENCOUNTER — Encounter: Payer: Self-pay | Admitting: Physician Assistant

## 2014-02-19 ENCOUNTER — Encounter: Payer: Self-pay | Admitting: Physician Assistant

## 2014-02-19 DIAGNOSIS — Z9049 Acquired absence of other specified parts of digestive tract: Secondary | ICD-10-CM | POA: Insufficient documentation

## 2014-08-25 ENCOUNTER — Ambulatory Visit (INDEPENDENT_AMBULATORY_CARE_PROVIDER_SITE_OTHER): Payer: BLUE CROSS/BLUE SHIELD | Admitting: Family Medicine

## 2014-08-25 ENCOUNTER — Encounter: Payer: Self-pay | Admitting: Family Medicine

## 2014-08-25 VITALS — BP 124/74 | HR 85 | Wt 246.0 lb

## 2014-08-25 DIAGNOSIS — R300 Dysuria: Secondary | ICD-10-CM

## 2014-08-25 LAB — POCT URINALYSIS DIPSTICK
Bilirubin, UA: NEGATIVE
GLUCOSE UA: NEGATIVE
KETONES UA: NEGATIVE
NITRITE UA: NEGATIVE
Protein, UA: NEGATIVE
SPEC GRAV UA: 1.02
UROBILINOGEN UA: 1
pH, UA: 7.5

## 2014-08-25 MED ORDER — SULFAMETHOXAZOLE-TRIMETHOPRIM 800-160 MG PO TABS: 1.0000 | ORAL_TABLET | Freq: Two times a day (BID) | ORAL | Status: DC

## 2014-08-25 NOTE — Progress Notes (Signed)
   Subjective:    Patient ID: Melanie Perez, female    DOB: [birth year], 34 y.o.   MRN: [id number]  HPI Patient comes in today complaining of 2 days of pelvic pain and dysuria. She says she had similar symptoms before her last 2 periods. Each time she took Pyridium, drink lots of fluids and trend clots a cranberry juice and the symptoms seemed to resolve on their own. Yesterday that her symptoms were more intense than they had been previously. She said in fact she was having chills. Not sure if she actually had fever or not. No hematuria. She has had urinary tract infections in the past but it has been a while she's had one. She does have irregular periods since she has PCO S.   Review of Systems     Objective:   Physical Exam  Constitutional: She is oriented to person, place, and time. She appears well-developed and well-nourished.  HENT:  Head: Normocephalic and atraumatic.  Cardiovascular: Normal rate, regular rhythm and normal heart sounds.   Pulmonary/Chest: Effort normal and breath sounds normal.  Musculoskeletal:  No CVA tenderness   Neurological: She is alert and oriented to person, place, and time.  Skin: Skin is warm and dry.  Psychiatric: She has a normal mood and affect. Her behavior is normal.          Assessment & Plan:  UTI- will tx with Bactrim. Call if not better in one week. Culture sent.

## 2014-08-27 LAB — URINE CULTURE
COLONY COUNT: NO GROWTH
Organism ID, Bacteria: NO GROWTH

## 2015-01-17 ENCOUNTER — Other Ambulatory Visit: Payer: Self-pay | Admitting: Physician Assistant

## 2015-02-10 ENCOUNTER — Ambulatory Visit (INDEPENDENT_AMBULATORY_CARE_PROVIDER_SITE_OTHER): Payer: BLUE CROSS/BLUE SHIELD | Admitting: Physician Assistant

## 2015-02-10 ENCOUNTER — Encounter: Payer: Self-pay | Admitting: Physician Assistant

## 2015-02-10 VITALS — Ht 69.0 in | Wt 261.0 lb

## 2015-02-10 DIAGNOSIS — Z6838 Body mass index (BMI) 38.0-38.9, adult: Secondary | ICD-10-CM | POA: Diagnosis not present

## 2015-02-10 DIAGNOSIS — N939 Abnormal uterine and vaginal bleeding, unspecified: Secondary | ICD-10-CM

## 2015-02-10 DIAGNOSIS — E282 Polycystic ovarian syndrome: Secondary | ICD-10-CM | POA: Diagnosis not present

## 2015-02-10 DIAGNOSIS — Z124 Encounter for screening for malignant neoplasm of cervix: Secondary | ICD-10-CM | POA: Diagnosis not present

## 2015-02-10 DIAGNOSIS — Z30431 Encounter for routine checking of intrauterine contraceptive device: Secondary | ICD-10-CM

## 2015-02-10 DIAGNOSIS — Z01419 Encounter for gynecological examination (general) (routine) without abnormal findings: Secondary | ICD-10-CM | POA: Diagnosis not present

## 2015-02-10 DIAGNOSIS — Z1151 Encounter for screening for human papillomavirus (HPV): Secondary | ICD-10-CM

## 2015-02-10 NOTE — Patient Instructions (Signed)

## 2015-02-10 NOTE — Progress Notes (Signed)
Patient ID: Melanie Perez, female   DOB: May 28, 1980, 34 y.o.   MRN: [id number] History:  Semiyah Newgent is a 34 y.o. [id number] who presents to clinic today for annual exam and evaluation of irregular bleeding.  LMP on 10/11 - continues to spot all month.  She has more bleeding with activity - like a light period.  No bleeding if all she is doing is sleeping or relaxing.   Has ParaGard in place.  Is unable to use estrogen due to h/o DVT.  Has not had recent illness, vaginal discharge, odor.  Denies fever, weakness, SOB, CP, dysuria.   Does monthly SBE and no concerns.   Eating, drinking and bathroom habits all normal.     The following portions of the patient's history were reviewed and updated as appropriate: allergies, current medications, past family history, past medical history, past social history, past surgical history and problem list.  Review of Systems:  Pertinent items noted in HPI and remainder of comprehensive ROS otherwise negative.  Objective:  Physical Exam Ht 5\' 9"  (1.753 m)  Wt 261 lb (118.389 kg)  BMI 38.53 kg/m2  LMP 01/20/2015 GENERAL: Well-developed, obese female in no acute distress.  HEENT: Normocephalic, atraumatic.  NECK: Supple. Normal thyroid.  RESPIRATORY: Normal rate. Clear to auscultation bilaterally.  CARDIOVASCULAR: Regular rate and rhythm with no adventitious sounds.  BREASTS: Symmetric in size. No masses, skin changes, nipple drainage, or lymphadenopathy. ABDOMEN: Soft, nontender, nondistended. No organomegaly. Normal bowel sounds appreciated in all quadrants.  PELVIC: Normal external female genitalia. Vagina is pink and rugated.  Mod amt of dark red blood in vault with active bleeding from cervix.   Normal cervix contour.  IUD strings present.  Pap smear obtained.  No adnexal mass or tenderness. Unable to fully appreciate fundus due to habitus.   EXTREMITIES: No cyanosis or clubbing, 2+ distal pulses, trace edema bilat, significant varicose veins bilat.   Labs  and Imaging No results found.  Assessment & Plan:  Assessment: 1. Abnormal uterine bleeding (AUB)   2. Well woman exam with routine gynecological exam   3. PCOS (polycystic ovarian syndrome)   4. BMI 38.0-38.9,adult      Plans: 1.  Labs ordered: FSH, TSH, CBC 2.  U/S ordered to [facility] for fibroids - prior u/s with small fibroids present 3.  Continue monthly SBE.  Consider vasectomy for husband/changing of IUD 4.  F/u in 1 month to discuss results of testing 5.  Will not give estrogen due to h/o DVT.   6.  Annual exam due 1 year  Paticia Stack, PA-C 02/10/2015 9:57 AM

## 2015-02-11 ENCOUNTER — Telehealth: Payer: Self-pay | Admitting: *Deleted

## 2015-02-11 LAB — CBC
HEMATOCRIT: 43.5 % (ref 36.0–46.0)
Hemoglobin: 14.5 g/dL (ref 12.0–15.0)
MCH: 29.5 pg (ref 26.0–34.0)
MCHC: 33.3 g/dL (ref 30.0–36.0)
MCV: 88.6 fL (ref 78.0–100.0)
MPV: 10 fL (ref 8.6–12.4)
PLATELETS: 370 10*3/uL (ref 150–400)
RBC: 4.91 MIL/uL (ref 3.87–5.11)
RDW: 14.1 % (ref 11.5–15.5)
WBC: 8.2 10*3/uL (ref 4.0–10.5)

## 2015-02-11 LAB — FOLLICLE STIMULATING HORMONE: FSH: 5.7 m[IU]/mL

## 2015-02-11 LAB — TSH: TSH: 1.411 u[IU]/mL (ref 0.350–4.500)

## 2015-02-11 NOTE — Telephone Encounter (Signed)
Pt notified of normal TSH and FSH.  Awaiting [facility] report.

## 2015-02-13 LAB — CYTOLOGY - PAP

## 2015-02-17 ENCOUNTER — Ambulatory Visit (INDEPENDENT_AMBULATORY_CARE_PROVIDER_SITE_OTHER): Payer: BLUE CROSS/BLUE SHIELD

## 2015-02-17 DIAGNOSIS — N939 Abnormal uterine and vaginal bleeding, unspecified: Secondary | ICD-10-CM

## 2015-02-17 DIAGNOSIS — Z975 Presence of (intrauterine) contraceptive device: Secondary | ICD-10-CM

## 2015-02-17 DIAGNOSIS — N852 Hypertrophy of uterus: Secondary | ICD-10-CM | POA: Diagnosis not present

## 2015-02-17 DIAGNOSIS — D259 Leiomyoma of uterus, unspecified: Secondary | ICD-10-CM | POA: Diagnosis not present

## 2015-02-24 ENCOUNTER — Ambulatory Visit (INDEPENDENT_AMBULATORY_CARE_PROVIDER_SITE_OTHER): Payer: BLUE CROSS/BLUE SHIELD | Admitting: Obstetrics & Gynecology

## 2015-02-24 VITALS — BP 128/79 | HR 71 | Resp 16 | Ht 69.0 in | Wt 261.0 lb

## 2015-02-24 DIAGNOSIS — N939 Abnormal uterine and vaginal bleeding, unspecified: Secondary | ICD-10-CM

## 2015-02-24 MED ORDER — MEDROXYPROGESTERONE ACETATE 10 MG PO TABS: ORAL_TABLET | ORAL | Status: DC

## 2015-02-24 NOTE — Progress Notes (Signed)
   Subjective:    Patient ID: Melanie Perez, female    DOB: [birth year], 34 y.o.   MRN: [id number]  HPI 34 yo MW P2 (64 and 58 yo kids) here today for u/s results. [name] ordered this due to her issue of irregular bleeding. She has had some irregular bleeding for years, diagnosed with PCOS. She has the Paragard IUD in place (due to be removed next year).   Review of Systems     Objective:   Physical Exam [id number] Breathing, conversing,and ambulating normally       Assessment & Plan:  Contraception- Husband plans to get vasectomy before next year Irregular periods/PCOS/fibroids- I offered cyclic provera, Mirena, d&c with ablation, hysterectomy She will consider options and let me know

## 2015-03-13 ENCOUNTER — Other Ambulatory Visit: Payer: Self-pay | Admitting: Physician Assistant

## 2015-03-16 ENCOUNTER — Telehealth: Payer: Self-pay | Admitting: *Deleted

## 2015-03-16 DIAGNOSIS — E282 Polycystic ovarian syndrome: Secondary | ICD-10-CM

## 2015-03-16 MED ORDER — SPIRONOLACTONE 50 MG PO TABS: 50.0000 mg | ORAL_TABLET | Freq: Two times a day (BID) | ORAL | Status: DC

## 2015-03-16 NOTE — Telephone Encounter (Signed)
Rx Ok for RF on spironolactone sent to [facility] per Dr Hulan Fray.

## 2015-08-05 ENCOUNTER — Ambulatory Visit (INDEPENDENT_AMBULATORY_CARE_PROVIDER_SITE_OTHER): Payer: 59 | Admitting: Obstetrics & Gynecology

## 2015-08-05 ENCOUNTER — Encounter: Payer: Self-pay | Admitting: Obstetrics & Gynecology

## 2015-08-05 VITALS — BP 107/68 | HR 68 | Resp 16 | Ht 69.0 in | Wt 268.0 lb

## 2015-08-05 DIAGNOSIS — N939 Abnormal uterine and vaginal bleeding, unspecified: Secondary | ICD-10-CM

## 2015-08-05 DIAGNOSIS — Z30432 Encounter for removal of intrauterine contraceptive device: Secondary | ICD-10-CM

## 2015-08-05 NOTE — Patient Instructions (Signed)
Endometrial Ablation °Endometrial ablation removes the lining of the uterus (endometrium). It is usually a same-day, outpatient treatment. Ablation helps avoid major surgery, such as surgery to remove the cervix and uterus (hysterectomy). After endometrial ablation, you will have little or no menstrual bleeding and may not be able to have children. However, if you are premenopausal, you will need to use a reliable method of birth control following the procedure because of the small chance that pregnancy can occur. °There are different reasons to have this procedure. These reasons include: °· Heavy periods. °· Bleeding that is causing anemia. °· Irregular bleeding. °· Bleeding fibroids on the lining inside the uterus if they are smaller than 3 centimeters. °This procedure may not be possible for you if:  °· You want to have children in the future.   °· You have severe cramps with your menstrual period.   °· You have precancerous or cancerous cells in your uterus.   °· You were recently pregnant.   °· You have gone through menopause.   °· You have had major surgery on your uterus, resulting in thinning of the uterine wall. Surgeries may include: °¨ The removal of one or more uterine fibroids (myomectomy). °¨ A cesarean section with a classic (vertical) incision on your uterus. Ask your health care provider what type of cesarean you had. Sometimes the scar on your skin is different than the scar on your uterus. °Even if you have had surgery on your uterus, certain types of ablation may still be safe for you. Talk with your health care provider. °LET YOUR HEALTH CARE PROVIDER KNOW ABOUT: °· Any allergies you have. °· All medicines you are taking, including vitamins, herbs, eye drops, creams, and over-the-counter medicines. °· Previous problems you or members of your family have had with the use of anesthetics. °· Any blood disorders you have. °· Previous surgeries you have had. °· Medical conditions you have. °RISKS AND  COMPLICATIONS  °Generally, this is a safe procedure. However, as with any procedure, complications can occur. Possible complications include: °· Perforation of the uterus. °· Bleeding. °· Infection of the uterus, bladder, or vagina. °· Injury to surrounding organs. °· An air bubble to the lung (air embolus). °· Pregnancy following the procedure. °· Failure of the procedure to help the problem, requiring hysterectomy. °· Decreased ability to diagnose cancer in the lining of the uterus. °BEFORE THE PROCEDURE °· The lining of the uterus must be tested to make sure there is no pre-cancerous or cancer cells present. °· An ultrasound may be performed to look at the size of the uterus and to check for abnormalities. °· Medicines may be given to thin the lining of the uterus. °PROCEDURE  °During the procedure, your health care provider will use a tool called a resectoscope to help see inside your uterus. There are different ways to remove the lining of your uterus.  °· Radiofrequency - This method uses a radiofrequency-alternating electric current to remove the lining of the uterus. °· Cryotherapy - This method uses extreme cold to freeze the lining of the uterus. °· Heated-Free Liquid - This method uses heated salt (saline) solution to remove the lining of the uterus. °· Microwave - This method uses high-energy microwaves to heat up the lining of the uterus to remove it. °· Thermal balloon - This method involves inserting a catheter with a balloon tip into the uterus. The balloon tip is filled with heated fluid to remove the lining of the uterus. °AFTER THE PROCEDURE  °After your procedure, do   not have sexual intercourse or insert anything into your vagina until permitted by your health care provider. After the procedure, you may experience: °· Cramps. °· Vaginal discharge. °· Frequent urination. °  °This information is not intended to replace advice given to you by your health care provider. Make sure you discuss any  questions you have with your health care provider. °  °Document Released: 02/05/2004 Document Revised: 12/17/2014 Document Reviewed: 08/29/2012 °Elsevier Interactive Patient Education ©2016 [employer] ° °

## 2015-08-05 NOTE — Progress Notes (Signed)
    [place] PROCEDURE NOTE  Melanie Perez is a 35 y.o. [id number] here for Paragard IUD removal. No GYN concerns.  Last pap smear was on 02/10/2015 and was normal with negative HRHPV.  IUD Removal  Patient identified, informed consent performed, consent signed.  Patient was in the dorsal lithotomy position, normal external genitalia was noted.  A speculum was placed in the patient's vagina, normal discharge was noted, no lesions. The cervix was visualized, no lesions, no abnormal discharge.  The strings of the IUD were grasped and pulled using ring forceps. The IUD was removed in its entirety.   Patient tolerated the procedure well.    Patient will use condoms for contraception.  Has history of AUB, contemplating endometrial ablation, information given to patient.  Routine preventative health maintenance measures emphasized.   Verita Schneiders, MD, [place] Attending Obstetrician & Gynecologist, [place] for [facility]

## 2015-10-06 ENCOUNTER — Encounter: Payer: Self-pay | Admitting: Physician Assistant

## 2015-10-06 ENCOUNTER — Ambulatory Visit (INDEPENDENT_AMBULATORY_CARE_PROVIDER_SITE_OTHER): Payer: 59 | Admitting: Physician Assistant

## 2015-10-06 VITALS — BP 130/77 | HR 74 | Ht 69.0 in | Wt 271.0 lb

## 2015-10-06 DIAGNOSIS — Z13220 Encounter for screening for lipoid disorders: Secondary | ICD-10-CM

## 2015-10-06 DIAGNOSIS — E282 Polycystic ovarian syndrome: Secondary | ICD-10-CM | POA: Diagnosis not present

## 2015-10-06 DIAGNOSIS — Z0000 Encounter for general adult medical examination without abnormal findings: Secondary | ICD-10-CM | POA: Diagnosis not present

## 2015-10-06 DIAGNOSIS — Z131 Encounter for screening for diabetes mellitus: Secondary | ICD-10-CM

## 2015-10-06 DIAGNOSIS — R0683 Snoring: Secondary | ICD-10-CM

## 2015-10-06 DIAGNOSIS — N939 Abnormal uterine and vaginal bleeding, unspecified: Secondary | ICD-10-CM | POA: Diagnosis not present

## 2015-10-06 DIAGNOSIS — L700 Acne vulgaris: Secondary | ICD-10-CM

## 2015-10-06 DIAGNOSIS — R5383 Other fatigue: Secondary | ICD-10-CM

## 2015-10-06 MED ORDER — SPIRONOLACTONE 100 MG PO TABS: 100.0000 mg | ORAL_TABLET | Freq: Two times a day (BID) | ORAL | Status: DC

## 2015-10-06 MED ORDER — METFORMIN HCL 500 MG PO TABS: 500.0000 mg | ORAL_TABLET | Freq: Two times a day (BID) | ORAL | Status: DC

## 2015-10-06 MED ORDER — LIRAGLUTIDE -WEIGHT MANAGEMENT 18 MG/3ML ~~LOC~~ SOPN: 0.6000 mg | PEN_INJECTOR | Freq: Every day | SUBCUTANEOUS | Status: DC

## 2015-10-06 MED ORDER — ADAPALENE-BENZOYL PEROXIDE 0.1-2.5 % EX GEL: 1.0000 "application " | Freq: Every day | CUTANEOUS | Status: DC

## 2015-10-06 NOTE — Patient Instructions (Addendum)
Keeping You Healthy  Get These Tests 1. Blood Pressure- Have your blood pressure checked once a year by your health care provider.  Normal blood pressure is 120/80. 2. Weight- Have your body mass index (BMI) calculated to screen for obesity.  BMI is measure of body fat based on height and weight.  You can also calculate your own BMI at GravelBags.it. 3. Cholesterol- Have your cholesterol checked every 5 years starting at age 36 then yearly starting at age 36. 58. Chlamydia, HIV, and other sexually transmitted diseases- Get screened every year until age 74, then within three months of each new sexual provider. 5. Pap Test - Every 1-5 years; discuss with your health care provider. 6. Mammogram- Every 1-2 years starting at age 49--50  Take these medicines  Calcium with Vitamin D-Your body needs 1200 mg of Calcium each day and [phone] IU of Vitamin D daily.  Your body can only absorb 500 mg of Calcium at a time so Calcium must be taken in 2 or 3 divided doses throughout the day.  Multivitamin with folic acid- Once daily if it is possible for you to become pregnant.  Get these Immunizations  Gardasil-Series of three doses; prevents HPV related illness such as genital warts and cervical cancer.  Menactra-Single dose; prevents meningitis.  Tetanus shot- Every 10 years.  Flu shot-Every year.  Take these steps 1. Do not smoke-Your healthcare provider can help you quit.  For tips on how to quit go to www.smokefree.gov or call 1-800 QUITNOW. 2. Be physically active- Exercise 5 days a week for at least 30 minutes.  If you are not already physically active, start slow and gradually work up to 30 minutes of moderate physical activity.  Examples of moderate activity include walking briskly, dancing, swimming, bicycling, etc. 3. Breast Cancer- A self breast exam every month is important for early detection of breast cancer.  For more information and instruction on self breast exams, ask  your healthcare provider or https://www.patel.info/. 4. Eat a healthy diet- Eat a variety of healthy foods such as fruits, vegetables, whole grains, low fat milk, low fat cheeses, yogurt, lean meats, poultry and fish, beans, nuts, tofu, etc.  For more information go to www. Thenutritionsource.org 5. Drink alcohol in moderation- Limit alcohol intake to one drink or less per day. Never drink and drive. 6. Depression- Your emotional health is as important as your physical health.  If you're feeling down or losing interest in things you normally enjoy please talk to your healthcare provider about being screened for depression. 7. Dental visit- Brush and floss your teeth twice daily; visit your dentist twice a year. 8. Eye doctor- Get an eye exam at least every 2 years. 9. Helmet use- Always wear a helmet when riding a bicycle, motorcycle, rollerblading or skateboarding. 63. Safe sex- If you may be exposed to sexually transmitted infections, use a condom. 11. Seat belts- Seat belts can save your live; always wear one. 12. Smoke/Carbon Monoxide detectors- These detectors need to be installed on the appropriate level of your home. Replace batteries at least once a year. 13. Skin cancer- When out in the sun please cover up and use sunscreen 15 SPF or higher. 14. Violence- If anyone is threatening or hurting you, please tell your healthcare provider.       Liraglutide injection (Weight Management) What is this medicine? LIRAGLUTIDE (LIR a GLOO tide) is used with a reduced calorie diet and exercise to help you lose weight. This medicine may be used  for other purposes; ask your health care provider or pharmacist if you have questions. What should I tell my health care provider before I take this medicine? They need to know if you have any of these conditions: -endocrine tumors (MEN 2) or if someone in your family had these tumors -gallstones -high cholesterol -history of alcohol  abuse problem -history of pancreatitis -kidney disease or if you are on dialysis -liver disease -previous swelling of the tongue, face, or lips with difficulty breathing, difficulty swallowing, hoarseness, or tightening of the throat -stomach problems -suicidal thoughts, plans, or attempt; a previous suicide attempt by you or a family member -thyroid cancer or if someone in your family had thyroid cancer -an unusual or allergic reaction to liraglutide, medicines, foods, dyes, or preservatives -pregnant or trying to get pregnant -breast-feeding How should I use this medicine? This medicine is for injection under the skin of your upper leg, stomach area, or upper arm. You will be taught how to prepare and give this medicine. Use exactly as directed. Take your medicine at regular intervals. Do not take it more often than directed. It is important that you put your used needles and syringes in a special sharps container. Do not put them in a trash can. If you do not have a sharps container, call your pharmacist or healthcare provider to get one. A special MedGuide will be given to you by the pharmacist with each prescription and refill. Be sure to read this information carefully each time. Talk to your pediatrician regarding the use of this medicine in children. Special care may be needed. Overdosage: If you think you have taken too much of this medicine contact a poison control center or emergency room at once. NOTE: This medicine is only for you. Do not share this medicine with others. What if I miss a dose? If you miss a dose, take it as soon as you can. If it is almost time for your next dose, take only that dose. Do not take double or extra doses. If you miss your dose for 3 days or more, call your doctor or health care professional to talk about how to restart this medicine. What may interact with this medicine? -acetaminophen -atorvastatin -birth control  pills -digoxin -griseofulvin -lisinopril This list may not describe all possible interactions. Give your health care provider a list of all the medicines, herbs, non-prescription drugs, or dietary supplements you use. Also tell them if you smoke, drink alcohol, or use illegal drugs. Some items may interact with your medicine. What should I watch for while using this medicine? Visit your doctor or health care professional for regular checks on your progress. This medicine is intended to be used in addition to a healthy diet and appropriate exercise. The best results are achieved this way. Do not increase or in any way change your dose without consulting your doctor or health care professional. This medicine may affect blood sugar levels. If you have diabetes, check with your doctor or health care professional before you change your diet or the dose of your diabetic medicine. Patients and their families should watch out for worsening depression or thoughts of suicide. Also watch out for sudden changes in feelings such as feeling anxious, agitated, panicky, irritable, hostile, aggressive, impulsive, severely restless, overly excited and hyperactive, or not being able to sleep. If this happens, especially at the beginning of treatment or after a change in dose, call your health care professional. What side effects may I notice from receiving  this medicine? Side effects that you should report to your doctor or health care professional as soon as possible: -allergic reactions like skin rash, itching or hives, swelling of the face, lips, or tongue -breathing problems -fever, chills -loss of appetite -signs and symptoms of low blood sugar such as feeling anxious, confusion, dizziness, increased hunger, unusually weak or tired, sweating, shakiness, cold, irritable, headache, blurred vision, fast heartbeat, loss of consciousness -trouble passing urine or change in the amount of urine -unusual stomach pain or  upset -vomiting Side effects that usually do not require medical attention (Report these to your doctor or health care professional if they continue or are bothersome.): -constipation -diarrhea -fatigue -headache -nausea This list may not describe all possible side effects. Call your doctor for medical advice about side effects. You may report side effects to FDA at 1-800-FDA-1088. Where should I keep my medicine? Keep out of the reach of children. Store unopened pen in a refrigerator between 2 and 8 degrees C (36 and 46 degrees F). Do not freeze or use if the medicine has been frozen. Protect from light and excessive heat. After you first use the pen, it can be stored at room temperature between 15 and 30 degrees C (59 and 86 degrees F) or in a refrigerator. Throw away your used pen after 30 days or after the expiration date, whichever comes first. Do not store your pen with the needle attached. If the needle is left on, medicine may leak from the pen. NOTE: This sheet is a summary. It may not cover all possible information. If you have questions about this medicine, talk to your doctor, pharmacist, or health care provider.    2016, Elsevier/Gold Standard. (2013-05-23 12:29:49)

## 2015-10-06 NOTE — Progress Notes (Signed)
Subjective:    Patient ID: Melanie Perez, female    DOB: November 29, 1980, 35 y.o.   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 female and is here for a comprehensive physical exam. The patient reports problems - 1. she is very frustrated with weight. she has tried and failed belviq and phentermine. she wants to know other options. concerned provera is causing her to gain weight as well. 2. she notices that she is very tired. she can sleep all night and wake up tired. she admits to snoring. .  Social History   Social History  . Marital Status: Married    Spouse Name: N/A  . Number of Children: N/A  . Years of Education: N/A   Occupational History  . store Freight forwarder    Social History Main Topics  . Smoking status: Never Smoker   . Smokeless tobacco: Never Used  . Alcohol Use: No  . Drug Use: No  . Sexual Activity:    Partners: Male    Birth Control/ Protection: Condom   Other Topics Concern  . Not on file   Social History Narrative   Health Maintenance  Topic Date Due  . HIV Screening  01/13/1995  . INFLUENZA VACCINE  11/10/2015  . PAP SMEAR  02/09/2018  . TETANUS/TDAP  08/09/2025    The following portions of the patient's history were reviewed and updated as appropriate: allergies, current medications, past family history, past medical history, past social history, past surgical history and problem list.  Review of Systems Pertinent items noted in HPI and remainder of comprehensive ROS otherwise negative.   Objective:    BP 130/77 mmHg  Pulse 74  Ht 5\' 9"  (1.753 m)  Wt 271 lb (122.925 kg)  BMI 40.00 kg/m2 General appearance: alert, cooperative, appears stated age and morbidly obese Head: Normocephalic, without obvious abnormality,  atraumatic Eyes: conjunctivae/corneas clear. PERRL, EOM's intact. Fundi benign. Ears: normal TM's and external ear canals both ears Nose: Nares normal. Septum midline. Mucosa normal. No drainage or sinus tenderness. Throat: lips, mucosa, and tongue normal; teeth and gums normal Neck: no adenopathy, no carotid bruit, no JVD, supple, symmetrical, trachea midline and thyroid not enlarged, symmetric, no tenderness/mass/nodules Back: symmetric, no curvature. ROM normal. No CVA tenderness. Lungs: clear to auscultation bilaterally Heart: regular rate and rhythm, S1, S2 normal, no murmur, click, rub or gallop Abdomen: soft, non-tender; bowel sounds normal; no masses,  no organomegaly Extremities: extremities normal, atraumatic, no cyanosis or edema Pulses: 2+ and symmetric Skin: Skin color, texture, turgor normal. No rashes or lesions Lymph nodes: Cervical, supraclavicular, and axillary nodes normal. Neurologic: Alert and oriented X 3, normal strength and tone. Normal symmetric reflexes. Normal coordination and gait    Assessment:    Healthy female exam.      Plan:    CPE- Pap done at GYN. Vaccines up to date. Lipid, cmp ordered.   Morbid obesity- stop provera. Start saxenda. Discussed how to use and SE's. Follow up in 2 months.  STOP bang at 3 yes's. High risk OSA. Discussed with patient sleep study testing. Pt declined today. She wants to work on weight loss first then consider sleep study testing.  No energy- b12, vitamin D ordered. Suspect could be due to sleep apnea.   Abnormal uterine bleeding- stop provera. Consider ablation if abnormal bleeding resumes. Provera could be causing acne worsening and weight gain.   Varicose veins/venous stasis- increased spiroactolone 100mg  bid. Discussed compression. Low salt diet.   See After Visit Summary for Counseling Recommendations

## 2015-10-07 LAB — COMPLETE METABOLIC PANEL WITH GFR
ALT: 29 U/L (ref 6–29)
AST: 17 U/L (ref 10–30)
Albumin: 4 g/dL (ref 3.6–5.1)
Alkaline Phosphatase: 85 U/L (ref 33–115)
BUN: 11 mg/dL (ref 7–25)
CALCIUM: 9.2 mg/dL (ref 8.6–10.2)
CO2: 24 mmol/L (ref 20–31)
Chloride: 100 mmol/L (ref 98–110)
Creat: 0.69 mg/dL (ref 0.50–1.10)
GFR, Est African American: >89 mL/min (ref >=60)
GFR, Est Non African American: >89 mL/min (ref >=60)
Glucose, Bld: 84 mg/dL (ref 65–99)
Potassium: 4.4 mmol/L (ref 3.5–5.3)
SODIUM: 137 mmol/L (ref 135–146)
Total Bilirubin: 0.5 mg/dL (ref 0.2–1.2)
Total Protein: 7 g/dL (ref 6.1–8.1)

## 2015-10-07 LAB — VITAMIN B12: Vitamin B-12: 591 pg/mL (ref 200–1100)

## 2015-10-07 LAB — LIPID PANEL
CHOL/HDL RATIO: 4.1 ratio (ref <=5.0)
CHOLESTEROL: 223 mg/dL — AB (ref 125–200)
HDL: 55 mg/dL (ref >=46)
LDL Cholesterol: 150 mg/dL — ABNORMAL HIGH (ref <130)
Triglycerides: 90 mg/dL (ref <150)
VLDL: 18 mg/dL (ref <30)

## 2015-10-08 ENCOUNTER — Encounter: Payer: Self-pay | Admitting: Physician Assistant

## 2015-10-08 DIAGNOSIS — E559 Vitamin D deficiency, unspecified: Secondary | ICD-10-CM | POA: Insufficient documentation

## 2015-10-08 LAB — HEMOGLOBIN A1C
HEMOGLOBIN A1C: 5.1 % (ref <5.7)
MEAN PLASMA GLUCOSE: 100 mg/dL

## 2015-10-08 LAB — VITAMIN D 25 HYDROXY (VIT D DEFICIENCY, FRACTURES): Vit D, 25-Hydroxy: 20 ng/mL — ABNORMAL LOW (ref 30–100)

## 2015-10-08 MED ORDER — VITAMIN D (ERGOCALCIFEROL) 1.25 MG (50000 UNIT) PO CAPS: 50000.0000 [IU] | ORAL_CAPSULE | ORAL | Status: DC

## 2015-10-15 ENCOUNTER — Telehealth: Payer: Self-pay | Admitting: *Deleted

## 2015-10-15 NOTE — Telephone Encounter (Signed)
Pt left vm today wanting you to go ahead and send in Contrave for her.

## 2015-10-16 ENCOUNTER — Other Ambulatory Visit: Payer: Self-pay | Admitting: *Deleted

## 2015-10-16 ENCOUNTER — Other Ambulatory Visit: Payer: Self-pay | Admitting: Physician Assistant

## 2015-10-16 MED ORDER — NALTREXONE-BUPROPION HCL ER 8-90 MG PO TB12: ORAL_TABLET | ORAL | Status: DC

## 2015-10-16 NOTE — Telephone Encounter (Signed)
Ok will fax to pharmacy today. Please call pharmacy and given them coupon card information or have pt come by and pick up coupon card.

## 2015-10-16 NOTE — Progress Notes (Signed)
Insurance will not pay for beliviq. Without failure of saxenda and contrave. Sent contrave to start.

## 2015-10-16 NOTE — Telephone Encounter (Signed)
Coupon card information given to pharmacy.  PA is still required.

## 2015-10-27 ENCOUNTER — Telehealth: Payer: Self-pay | Admitting: *Deleted

## 2015-10-27 NOTE — Telephone Encounter (Signed)
Tried to initiate PA through covermymeds not able to verify patient. Had to call and they will fax over form

## 2015-11-09 NOTE — Telephone Encounter (Signed)
Melanie Perez has been approved through her insurance.  Message left on patient vm and pharm vm

## 2015-12-08 ENCOUNTER — Other Ambulatory Visit: Payer: Self-pay | Admitting: *Deleted

## 2015-12-08 MED ORDER — LIRAGLUTIDE -WEIGHT MANAGEMENT 18 MG/3ML ~~LOC~~ SOPN: 0.6000 mg | PEN_INJECTOR | Freq: Every day | SUBCUTANEOUS | 0 refills | Status: DC

## 2015-12-09 ENCOUNTER — Telehealth: Payer: Self-pay | Admitting: *Deleted

## 2015-12-09 NOTE — Telephone Encounter (Signed)
Initiated PA over the phone. Patient's rx group is [phone]) and her rx ID [phone].  They are faxing a form to be filled out to our office

## 2015-12-15 NOTE — Telephone Encounter (Signed)
PA form completed and faxed back

## 2015-12-17 NOTE — Telephone Encounter (Signed)
PA for Saxenda was denied. Pt must have tried and failed: Belqiv and Contrave. Will route to PCP.

## 2015-12-18 NOTE — Telephone Encounter (Signed)
She has tried both. See allergies.

## 2015-12-18 NOTE — Telephone Encounter (Signed)
Filed an appeal with insurance for [employer] approval. Informed of Pt's allergies. Waiting for faxed decision.

## 2016-01-05 ENCOUNTER — Other Ambulatory Visit: Payer: Self-pay | Admitting: Physician Assistant

## 2016-04-16 ENCOUNTER — Other Ambulatory Visit: Payer: Self-pay | Admitting: Physician Assistant

## 2016-08-09 ENCOUNTER — Ambulatory Visit (INDEPENDENT_AMBULATORY_CARE_PROVIDER_SITE_OTHER): Payer: 59 | Admitting: Physician Assistant

## 2016-08-09 ENCOUNTER — Telehealth: Payer: Self-pay | Admitting: Physician Assistant

## 2016-08-09 VITALS — BP 118/77 | HR 82 | Ht 69.0 in | Wt 279.0 lb

## 2016-08-09 DIAGNOSIS — F329 Major depressive disorder, single episode, unspecified: Secondary | ICD-10-CM

## 2016-08-09 DIAGNOSIS — F419 Anxiety disorder, unspecified: Secondary | ICD-10-CM | POA: Diagnosis not present

## 2016-08-09 DIAGNOSIS — R635 Abnormal weight gain: Secondary | ICD-10-CM

## 2016-08-09 DIAGNOSIS — F32A Depression, unspecified: Secondary | ICD-10-CM

## 2016-08-09 MED ORDER — ESCITALOPRAM OXALATE 5 MG PO TABS: 5.0000 mg | ORAL_TABLET | Freq: Every day | ORAL | 1 refills | Status: DC

## 2016-08-09 MED ORDER — LIRAGLUTIDE -WEIGHT MANAGEMENT 18 MG/3ML ~~LOC~~ SOPN: 0.6000 mg | PEN_INJECTOR | Freq: Every day | SUBCUTANEOUS | 1 refills | Status: DC

## 2016-08-09 MED ORDER — VITAMIN D (ERGOCALCIFEROL) 1.25 MG (50000 UNIT) PO CAPS: 50000.0000 [IU] | ORAL_CAPSULE | ORAL | 0 refills | Status: DC

## 2016-08-09 NOTE — Progress Notes (Signed)
   Subjective:    Patient ID: Melanie Perez, female    DOB: 1981-03-18, 36 y.o.   MRN: [id number]  HPI  Pt is a 36 yo female who presents to the clinic to discuss mood and weight. She has not been seen in a while. She has been off depression/anxiety medication for at least 7 years. She feels like "she cannot do it any more". She has tried both prozac and zoloft and they worked well but she gained weight. She cannot remember if wellbutrin worked or why she stopped it. She feels like she can't handle the stress at work. She just wants to cry all the time. She has no motivation to do anything. She continues to gain weight being off the medication as well and can't do anything to lose it. She tried phentermine in past and worked great the first time, when tried again did not work great and both times gained the weight back. She is not currently exercising. Denies any suicidal thoughts.    Review of Systems  All other systems reviewed and are negative.      Objective:   Physical Exam  Constitutional: She is oriented to person, place, and time. She appears well-developed and well-nourished.  Morbid obesity.   HENT:  Head: Normocephalic and atraumatic.  Cardiovascular: Normal rate, regular rhythm and normal heart sounds.   Pulmonary/Chest: Effort normal and breath sounds normal.  Neurological: She is alert and oriented to person, place, and time.  Psychiatric: She has a normal mood and affect. Her behavior is normal.          Assessment & Plan:   Marland KitchenMarland KitchenDiagnoses and all orders for this visit:  Anxiety and depression -     escitalopram (LEXAPRO) 5 MG tablet; Take 1 tablet (5 mg total) by mouth daily.  Abnormal weight gain -     Liraglutide -Weight Management (SAXENDA) 18 MG/3ML SOPN; Inject 0.6 mg into the skin daily. For one week then increase by .6mg  weekly until reaches 3mg  daily.  Please include ultra fine needles 52mm  Morbid obesity (HCC) -     Liraglutide -Weight Management (SAXENDA)  18 MG/3ML SOPN; Inject 0.6 mg into the skin daily. For one week then increase by .6mg  weekly until reaches 3mg  daily.  Please include ultra fine needles 40mm  Other orders -     Vitamin D, Ergocalciferol, (DRISDOL) 50000 units CAPS capsule; Take 1 capsule (50,000 Units total) by mouth every 7 (seven) days.     .. Depression screen PHQ 2/9 08/09/2016  Decreased Interest 1  Down, Depressed, Hopeless 1  PHQ - 2 Score 2  Altered sleeping 1  Tired, decreased energy 1  Change in appetite 2  Feeling bad or failure about yourself  2  Trouble concentrating 0  Moving slowly or fidgety/restless 0  Suicidal thoughts 0  PHQ-9 Score 8   .. GAD 7 : Generalized Anxiety Score 08/09/2016  Nervous, Anxious, on Edge 1  Control/stop worrying 1  Worry too much - different things 1  Trouble relaxing 1  Restless 2  Easily annoyed or irritable 1  Afraid - awful might happen 1  Total GAD 7 Score 8   Started lexapro.  Started saxenda. Discussed side effects and how to taper up. Pt has tried and failed phentermine, belviq, contrave.   Follow up in 4-6 weeks.   Spent 30 minutes with patient and greater than 50 percent of visit spent counseling patient regarding treatment plan.

## 2016-08-09 NOTE — Telephone Encounter (Signed)
Received PA for Vitamin D Sent through cover my meds waiting on determination. - CF

## 2016-08-10 ENCOUNTER — Encounter: Payer: Self-pay | Admitting: Physician Assistant

## 2016-08-10 NOTE — Telephone Encounter (Signed)
Received fax from [facility] and they denied request for Vit D2 1.25mg  (50,000 units) due to medication requires a letter of medical necessity. - CF

## 2016-09-12 ENCOUNTER — Telehealth: Payer: Self-pay | Admitting: Physician Assistant

## 2016-09-12 NOTE — Telephone Encounter (Signed)
Have not seen or done a PA on this, nor do I see where the pharmacy set a profile in covermymeds. Can check with Jenny Reichmann but I don't see anything. Can work on it

## 2016-09-12 NOTE — Telephone Encounter (Signed)
Yes ok to start looking into it and keep patient in the loop as she is requesting info.

## 2016-09-12 NOTE — Telephone Encounter (Signed)
Pt stated she has not heard back from the prior auth on her saxenda and qould like to know the status of this also the pharmacy this was sent to has closed and if it is approved it needs to be sent to [facility] [address] in [place] -- Thanks

## 2016-09-12 NOTE — Telephone Encounter (Signed)
Have you seen anything on this?  

## 2016-09-16 NOTE — Telephone Encounter (Signed)
[id number] Pre Authorization sent to cover my meds.

## 2016-09-22 NOTE — Telephone Encounter (Signed)
Called and left a message on patient's vm to call her insurance company to see what is going on

## 2016-09-22 NOTE — Telephone Encounter (Signed)
In response to the PA submitted for [employer] says members benefit is not active at this time

## 2016-09-28 ENCOUNTER — Ambulatory Visit: Payer: 59 | Admitting: Physician Assistant

## 2016-10-03 ENCOUNTER — Telehealth: Payer: Self-pay | Admitting: Physician Assistant

## 2016-10-03 NOTE — Telephone Encounter (Signed)
Received fax for PA on Saxenda filled out form and faxed back waiting on determination. - CF

## 2016-10-05 NOTE — Telephone Encounter (Signed)
Received fax that patient has been APPROVED for [name] from 10/04/16-10/04/17.Melanie Perez [place]

## 2016-10-05 NOTE — Telephone Encounter (Signed)
Pt notified of approval.Melanie Perez, [name]

## 2016-10-07 ENCOUNTER — Other Ambulatory Visit: Payer: Self-pay | Admitting: Physician Assistant

## 2016-10-07 DIAGNOSIS — F419 Anxiety disorder, unspecified: Principal | ICD-10-CM

## 2016-10-07 DIAGNOSIS — F32A Depression, unspecified: Secondary | ICD-10-CM

## 2016-10-07 DIAGNOSIS — F329 Major depressive disorder, single episode, unspecified: Secondary | ICD-10-CM

## 2016-10-13 ENCOUNTER — Telehealth: Payer: Self-pay | Admitting: Physician Assistant

## 2016-10-13 NOTE — Telephone Encounter (Signed)
Left a message with pts husband for her to call the office and schedule a f/u appt with Jade in August ( F/u Depression)

## 2016-11-04 ENCOUNTER — Other Ambulatory Visit: Payer: Self-pay | Admitting: Physician Assistant

## 2016-11-23 ENCOUNTER — Telehealth: Payer: Self-pay | Admitting: *Deleted

## 2016-11-23 NOTE — Telephone Encounter (Signed)
Pre Authorization sent to cover my meds.[id number]

## 2016-11-24 NOTE — Telephone Encounter (Signed)
Your request has been approved  Request Reference Number: [id number]. SAXENDA INJ 6MG /ML is approved through 05/26/2017. For further questions, call [phone].  called [facility] on [address] in [place] an patient is aware

## 2016-11-25 ENCOUNTER — Ambulatory Visit (INDEPENDENT_AMBULATORY_CARE_PROVIDER_SITE_OTHER): Payer: BLUE CROSS/BLUE SHIELD | Admitting: Physician Assistant

## 2016-11-25 ENCOUNTER — Encounter: Payer: Self-pay | Admitting: Physician Assistant

## 2016-11-25 VITALS — BP 120/77 | HR 81 | Ht 69.0 in | Wt 271.0 lb

## 2016-11-25 DIAGNOSIS — Z131 Encounter for screening for diabetes mellitus: Secondary | ICD-10-CM

## 2016-11-25 DIAGNOSIS — E559 Vitamin D deficiency, unspecified: Secondary | ICD-10-CM

## 2016-11-25 DIAGNOSIS — F329 Major depressive disorder, single episode, unspecified: Secondary | ICD-10-CM

## 2016-11-25 DIAGNOSIS — F419 Anxiety disorder, unspecified: Secondary | ICD-10-CM

## 2016-11-25 DIAGNOSIS — E7800 Pure hypercholesterolemia, unspecified: Secondary | ICD-10-CM | POA: Diagnosis not present

## 2016-11-25 MED ORDER — VENLAFAXINE HCL ER 37.5 MG PO CP24: 37.5000 mg | ORAL_CAPSULE | Freq: Every day | ORAL | 1 refills | Status: DC

## 2016-11-25 NOTE — Progress Notes (Signed)
Subjective:    Patient ID: Melanie Perez, female    DOB: 1980-05-21, 36 y.o.   MRN: [id number]  HPI  Pt is a 36 yo female who presents to the clinic to follow up.   At last visit she was given saxenda and used for a little over a month. She lost 8lbs but her insurance switched to deductible plan and would be 500 dollars a month. She continues to diet and exercise with little results.   She is on lexapro for anxiety and depression. She feels like it has helped her not to cry and be less emotional but she still has no motivation. She has used wellbutrin in the past and just had no benefit. She feels better but wants to have some motivation. She still sleeps a lot and not doing things she likes to do.   Last LDL was 150. She has not had checked in a while.  .. Active Ambulatory Problems    Diagnosis Date Noted  . PCOS (polycystic ovarian syndrome) 11/07/2012  . Fibroids 02/11/2013  . Abnormal weight gain 03/20/2013  . Acne 03/20/2013  . Venous insufficiency of left leg 04/24/2013  . History of DVT of lower extremity 04/24/2013  . Varicose veins 04/24/2013  . Superficial thrombophlebitis 05/02/2013  . Superficial thrombophlebitis of left leg 05/02/2013  . Varicose veins of lower extremities with other complications 12/87/8676  . BMI 34.0-34.9,adult 07/26/2013  . DVT (deep venous thrombosis) ([place]) 10/24/2013  . Excessive hair growth 01/12/2014  . S/P laparoscopic cholecystectomy 02/19/2014  . Abnormal uterine bleeding (AUB) 02/24/2015  . Vitamin D deficiency 10/08/2015  . Anxiety and depression 11/26/2016  . Morbid obesity ([place]) 11/26/2016   Resolved Ambulatory Problems    Diagnosis Date Noted  . BMI 37.0-37.9, adult 03/20/2013   Past Medical History:  Diagnosis Date  . Abnormal Pap smear   . HPV in female   . Varicose veins        Review of Systems See HPI.     Objective:   Physical Exam  Constitutional: She is oriented to person, place, and time. She appears  well-developed and well-nourished.  Obese.   HENT:  Head: Normocephalic and atraumatic.  Cardiovascular: Normal rate, regular rhythm and normal heart sounds.   Pulmonary/Chest: Effort normal and breath sounds normal.  Neurological: She is alert and oriented to person, place, and time.  Psychiatric: She has a normal mood and affect. Her behavior is normal.          Assessment & Plan:  Marland KitchenMarland KitchenDiagnoses and all orders for this visit:  Anxiety and depression -     venlafaxine XR (EFFEXOR-XR) 37.5 MG 24 hr capsule; Take 1 capsule (37.5 mg total) by mouth daily with breakfast.  Elevated LDL cholesterol level -     Cancel: Lipid panel -     Lipid panel  Vitamin D deficiency -     Cancel: VITAMIN D 25 Hydroxy (Vit-D Deficiency, Fractures) -     VITAMIN D 25 Hydroxy (Vit-D Deficiency, Fractures)  Screening for diabetes mellitus -     COMPLETE METABOLIC PANEL WITH GFR -     Comprehensive metabolic panel  Morbid obesity (HCC)    .[name] Depression screen [facility] 2/9 11/26/2016 08/09/2016  Decreased Interest 1 1  Down, Depressed, Hopeless 0 1  PHQ - 2 Score 1 2  Altered sleeping 0 1  Tired, decreased energy 1 1  Change in appetite 0 2  Feeling bad or failure about yourself  1 2  Trouble  concentrating 0 0  Moving slowly or fidgety/restless 1 0  Suicidal thoughts 0 0  PHQ-9 Score 4 8  Difficult doing work/chores Somewhat difficult -   .. GAD 7 : Generalized Anxiety Score 11/26/2016 08/09/2016  Nervous, Anxious, on Edge 0 1  Control/stop worrying - 1  Worry too much - different things 0 1  Trouble relaxing 0 1  Restless 0 2  Easily annoyed or irritable 0 1  Afraid - awful might happen 0 1  Total GAD 7 Score - 8  Anxiety Difficulty Not difficult at all -   Will stop lexapro and switch to effexor to see if will continue to help anxiety but also give her more motivation. Follow up in 4-6 weeks.   Cannot afford saxenda. Tried other weight loss drugs. Discussed bariatric referral. She  declined but will think about it.

## 2016-11-26 DIAGNOSIS — E66812 Obesity, class 2: Secondary | ICD-10-CM | POA: Insufficient documentation

## 2016-11-26 DIAGNOSIS — F419 Anxiety disorder, unspecified: Principal | ICD-10-CM | POA: Insufficient documentation

## 2016-11-26 DIAGNOSIS — Z6837 Body mass index (BMI) 37.0-37.9, adult: Secondary | ICD-10-CM | POA: Insufficient documentation

## 2016-11-26 DIAGNOSIS — F329 Major depressive disorder, single episode, unspecified: Secondary | ICD-10-CM | POA: Insufficient documentation

## 2016-11-26 DIAGNOSIS — E6609 Other obesity due to excess calories: Secondary | ICD-10-CM | POA: Insufficient documentation

## 2016-11-29 LAB — LIPID PANEL
Cholesterol: 182 (ref 0–200)
HDL: 37 (ref 35–70)
LDL Cholesterol: 130
TRIGLYCERIDES: 73 (ref 40–160)

## 2016-12-14 ENCOUNTER — Encounter: Payer: Self-pay | Admitting: Physician Assistant

## 2017-01-15 IMAGING — US US TRANSVAGINAL NON-OB
1 series · 13 of 25 positions shown · non-contrast
Comparison: Pelvic ultrasound November 06, 2012

CLINICAL DATA: One month history of vaginal bleeding, history of
uterine fibroids, IUD, HPV infection. Most recent menstrual period
began on January 20, 2015.

EXAM:
TRANSABDOMINAL AND TRANSVAGINAL ULTRASOUND OF PELVIS
TECHNIQUE: Both transabdominal and transvaginal ultrasound examinations of the
pelvis were performed. Transabdominal technique was performed for
global imaging of the pelvis including uterus, ovaries, adnexal
regions, and pelvic cul-de-sac. It was necessary to proceed with
endovaginal exam following the transabdominal exam to visualize the
endometrium and ovaries..

[Series 1: us transvaginal non-ob · 0.24mm/px · 13 of 83 slices shown]
[im 1/83]
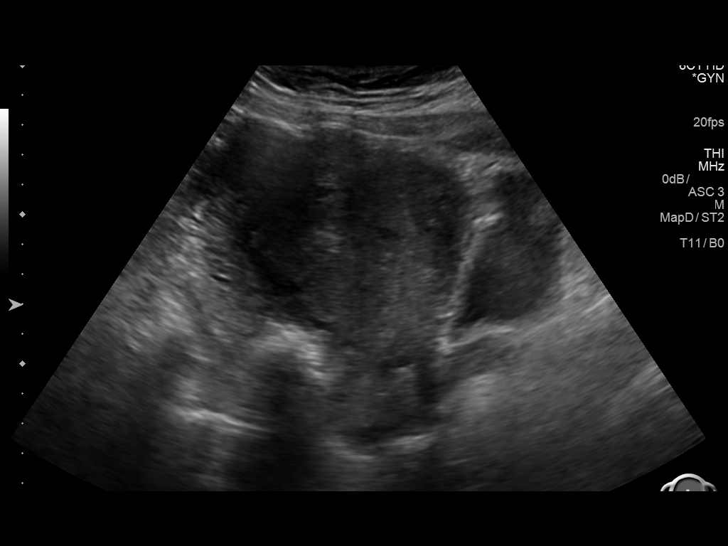
[im 7/83]
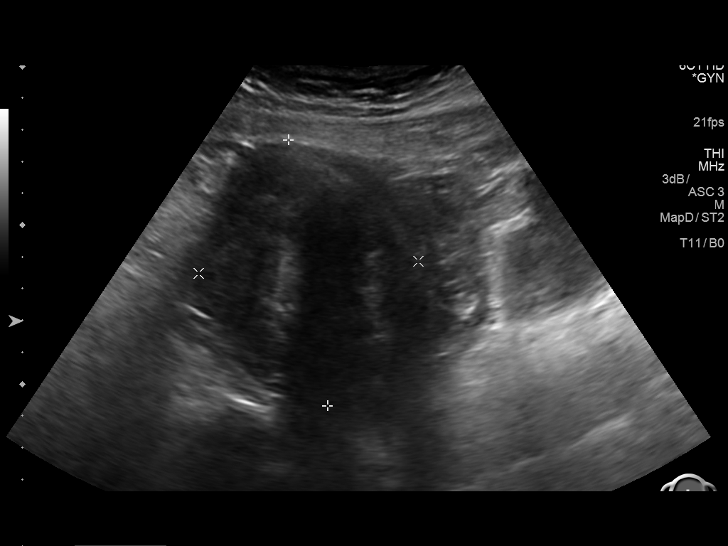
[im 14/83]
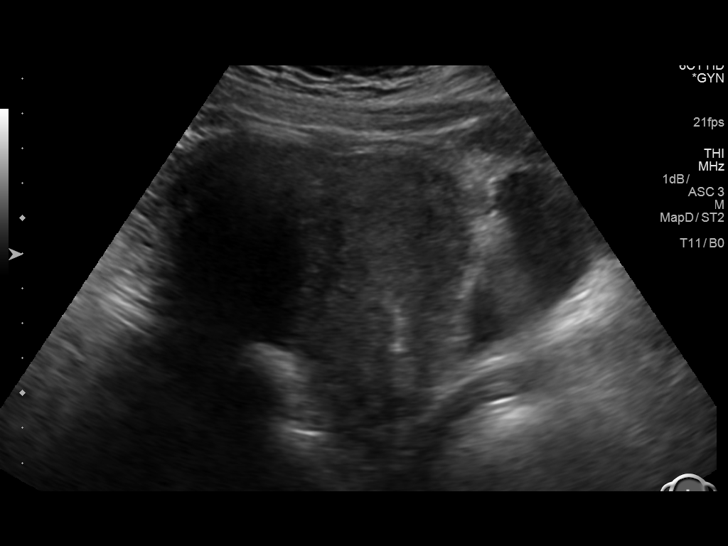
[im 21/83]
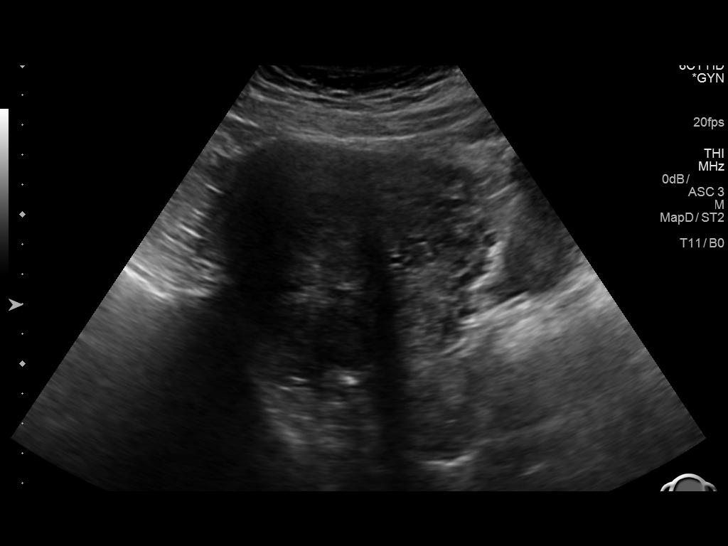
[im 28/83]
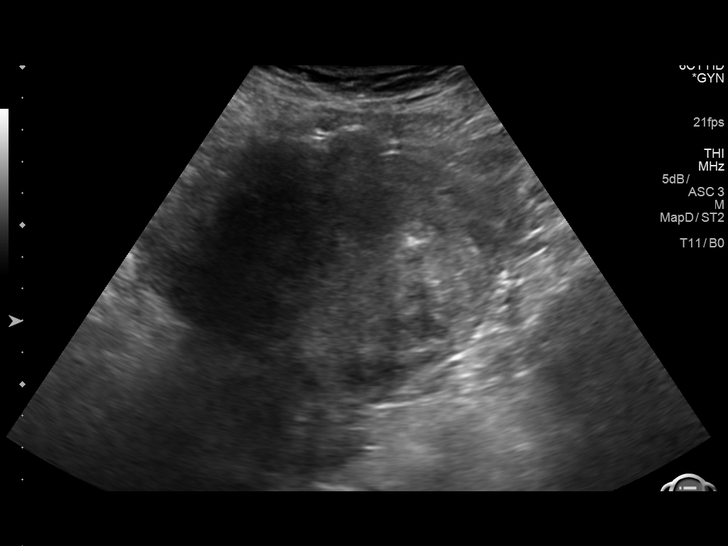
[im 35/83]
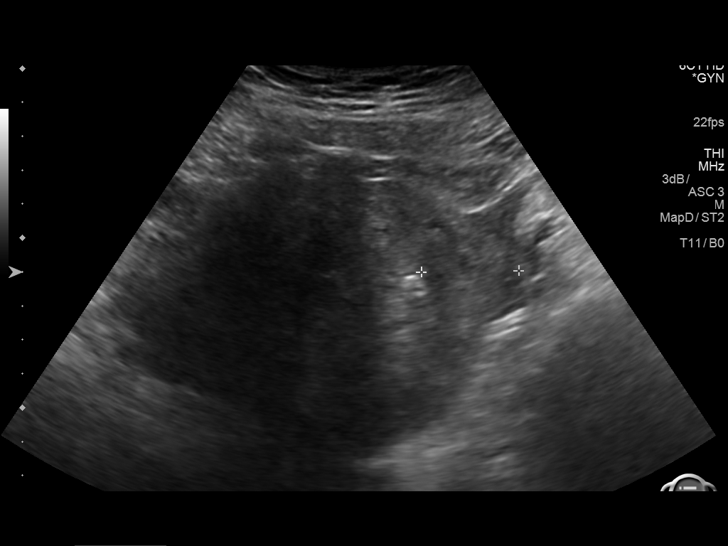
[im 42/83]
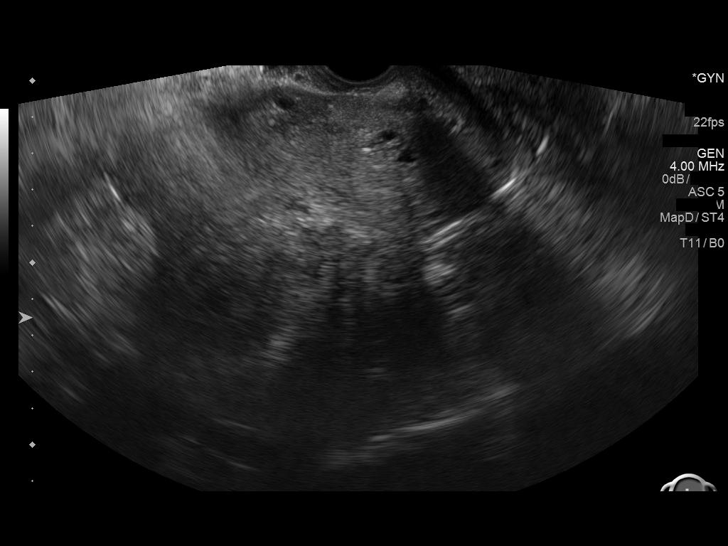
[im 48/83]
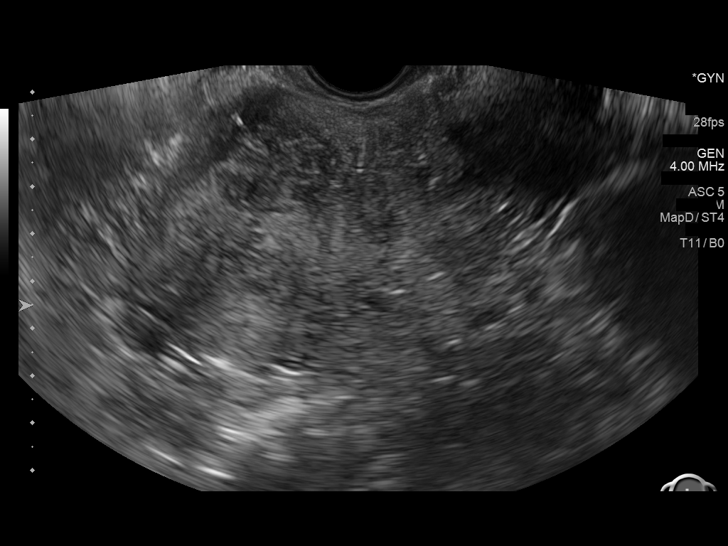
[im 55/83]
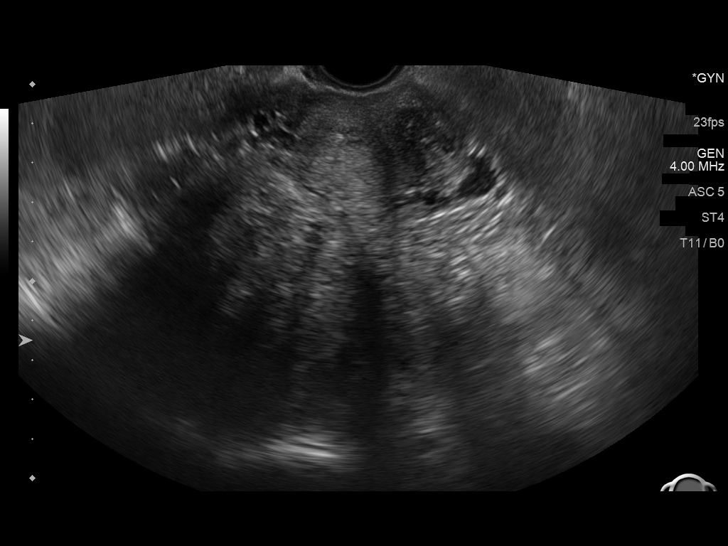
[im 62/83]
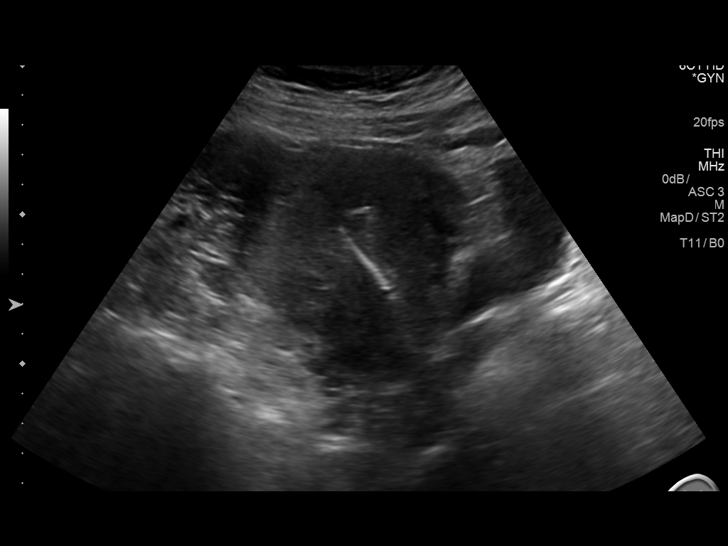
[im 69/83]
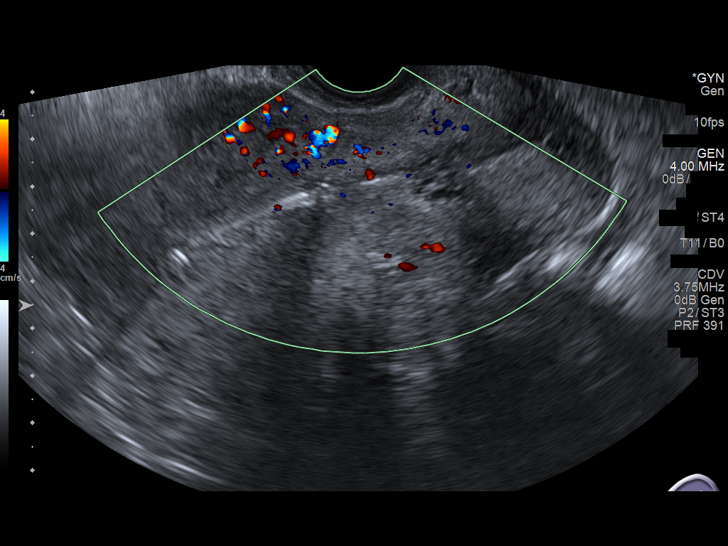
[im 76/83]
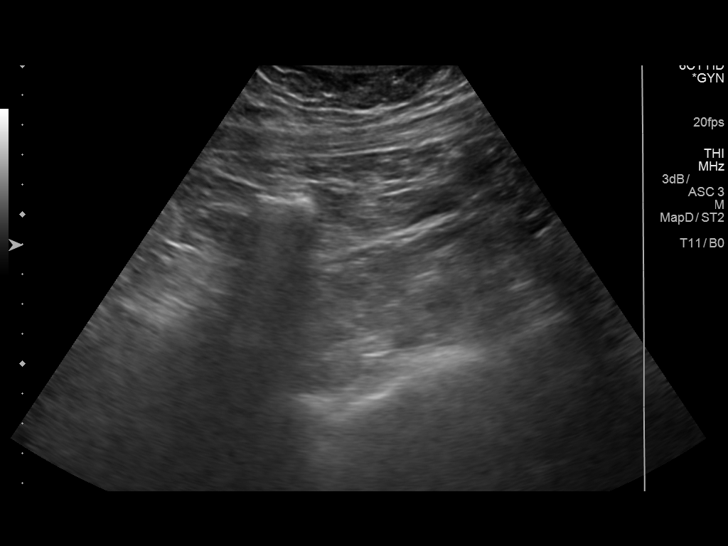
[im 83/83]
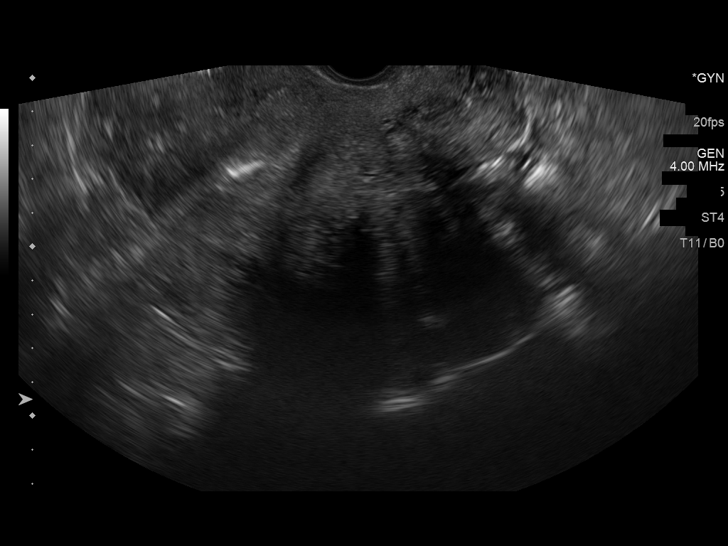

[13 of 25 positions shown; findings below may reference images not displayed]

FINDINGS: Uterus

Measurements: 10.2 x 10.7 x 11.4 cm. There are multiple slightly
hypoechoic rounded masses within the myometrium. The largest lies
posteriorly and is partially exophytic and measures 8.5 x 7 x
cm. A fundal fibroid measures 3.5 x 3.4 x 4.5 cm. An anterior
fibroid in the fundus measures 3.3 x 3.3 x 3.9 cm. A fibroid in the
lower anterior aspect of the uterine body measures 2 x 2 x 2.9 cm.

Endometrium

Thickness: 13.4 mm. An IUD is noted in the upper aspect of the
endometrial cavity.

Neither ovary could be demonstrated either transabdominal a or
transvaginally. This was due to bowel gas and the enlarged uterus.

Other findings

No free fluid.
IMPRESSION: 1. Enlarged heterogeneous appearing uterus with multiple fibroids.
The largest lies posteriorly in the body of the uterus and measures
8.5 x 7 x 7.5 cm.
2. The endometrial stripe measures 13.4 mm. The patient is
premenstrual. The IUD is visible in the upper aspect of the
endometrial cavity.
3. Nonvisualization of the ovaries.

## 2017-01-24 ENCOUNTER — Other Ambulatory Visit: Payer: Self-pay | Admitting: Physician Assistant

## 2017-01-24 DIAGNOSIS — F419 Anxiety disorder, unspecified: Principal | ICD-10-CM

## 2017-01-24 DIAGNOSIS — F329 Major depressive disorder, single episode, unspecified: Secondary | ICD-10-CM

## 2017-03-07 ENCOUNTER — Other Ambulatory Visit: Payer: Self-pay | Admitting: *Deleted

## 2017-03-07 DIAGNOSIS — F419 Anxiety disorder, unspecified: Principal | ICD-10-CM

## 2017-03-07 DIAGNOSIS — F329 Major depressive disorder, single episode, unspecified: Secondary | ICD-10-CM

## 2017-03-07 MED ORDER — VENLAFAXINE HCL ER 37.5 MG PO CP24: 37.5000 mg | ORAL_CAPSULE | Freq: Every day | ORAL | 0 refills | Status: DC

## 2017-04-07 ENCOUNTER — Encounter: Payer: Self-pay | Admitting: Physician Assistant

## 2017-04-07 ENCOUNTER — Ambulatory Visit (INDEPENDENT_AMBULATORY_CARE_PROVIDER_SITE_OTHER): Payer: Self-pay | Admitting: Physician Assistant

## 2017-04-07 VITALS — BP 124/77 | HR 75 | Temp 98.5°F | Resp 16 | Ht 69.0 in | Wt 287.0 lb

## 2017-04-07 DIAGNOSIS — F329 Major depressive disorder, single episode, unspecified: Secondary | ICD-10-CM

## 2017-04-07 DIAGNOSIS — L689 Hypertrichosis, unspecified: Secondary | ICD-10-CM

## 2017-04-07 DIAGNOSIS — E282 Polycystic ovarian syndrome: Secondary | ICD-10-CM

## 2017-04-07 DIAGNOSIS — T887XXA Unspecified adverse effect of drug or medicament, initial encounter: Secondary | ICD-10-CM

## 2017-04-07 DIAGNOSIS — F419 Anxiety disorder, unspecified: Secondary | ICD-10-CM

## 2017-04-07 DIAGNOSIS — F32A Depression, unspecified: Secondary | ICD-10-CM

## 2017-04-07 MED ORDER — VENLAFAXINE HCL ER 37.5 MG PO CP24: 37.5000 mg | ORAL_CAPSULE | Freq: Every day | ORAL | 3 refills | Status: DC

## 2017-04-07 MED ORDER — SPIRONOLACTONE 100 MG PO TABS: 100.0000 mg | ORAL_TABLET | Freq: Every day | ORAL | 3 refills | Status: DC

## 2017-04-07 NOTE — Progress Notes (Signed)
Subjective:    Patient ID: Melanie Perez, female    DOB: 1980/08/26, 36 y.o.   MRN: [id number]  HPI Patient is a 36 year old  Morbidly obese female who presents to the clinic to follow-up on recent Effexor start.  She presented to the clinic with anxiety and depression.  She reports she has had a significant benefit from this medication.  She is certainly noticed her anxiety and depression worsening over the past week since she has been without it.  She denies any suicidal thoughts or homicidal ideations.  She always feels completely normal when she has been on the medication.  She does not feel like she needs any dose increase.  She does admit that she has felt more numbness and tingling in her bilateral arms.  This is occasional.  She wonders if it could be a side effect of the medication.  Since she has not been taking it for the last week she has not experienced the symptoms. She would like to stay on medication.   Needs refill on spirolactlone. No problems or concerns. She has been having a few muscle cramps at night. She concerned her electrolytes might be off.   .. Active Ambulatory Problems    Diagnosis Date Noted  . PCOS (polycystic ovarian syndrome) 11/07/2012  . Fibroids 02/11/2013  . Abnormal weight gain 03/20/2013  . Acne 03/20/2013  . Venous insufficiency of left leg 04/24/2013  . History of DVT of lower extremity 04/24/2013  . Varicose veins 04/24/2013  . Superficial thrombophlebitis 05/02/2013  . Superficial thrombophlebitis of left leg 05/02/2013  . Varicose veins of lower extremities with other complications 45/62/5638  . BMI 34.0-34.9,adult 07/26/2013  . DVT (deep venous thrombosis) ([place]) 10/24/2013  . Excessive hair growth 01/12/2014  . S/P laparoscopic cholecystectomy 02/19/2014  . Abnormal uterine bleeding (AUB) 02/24/2015  . Vitamin D deficiency 10/08/2015  . Anxiety and depression 11/26/2016  . Morbidly obese ([place]) 11/26/2016  . Medication side effect 04/10/2017    Resolved Ambulatory Problems    Diagnosis Date Noted  . BMI 37.0-37.9, adult 03/20/2013   Past Medical History:  Diagnosis Date  . Abnormal Pap smear   . HPV in female   . Varicose veins      .[name] Active Ambulatory Problems    Diagnosis Date Noted  . PCOS (polycystic ovarian syndrome) 11/07/2012  . Fibroids 02/11/2013  . Abnormal weight gain 03/20/2013  . Acne 03/20/2013  . Venous insufficiency of left leg 04/24/2013  . History of DVT of lower extremity 04/24/2013  . Varicose veins 04/24/2013  . Superficial thrombophlebitis 05/02/2013  . Superficial thrombophlebitis of left leg 05/02/2013  . Varicose veins of lower extremities with other complications 93/73/4287  . BMI 34.0-34.9,adult 07/26/2013  . DVT (deep venous thrombosis) ([place]) 10/24/2013  . Excessive hair growth 01/12/2014  . S/P laparoscopic cholecystectomy 02/19/2014  . Abnormal uterine bleeding (AUB) 02/24/2015  . Vitamin D deficiency 10/08/2015  . Anxiety and depression 11/26/2016  . Morbidly obese ([place]) 11/26/2016  . Medication side effect 04/10/2017   Resolved Ambulatory Problems    Diagnosis Date Noted  . BMI 37.0-37.9, adult 03/20/2013   Past Medical History:  Diagnosis Date  . Abnormal Pap smear   . HPV in female   . Varicose veins       Review of Systems  All other systems reviewed and are negative.      Objective:   Physical Exam  Constitutional: She is oriented to person, place, and time. She appears well-developed and well-nourished.  Cardiovascular: Normal rate, regular rhythm and normal heart sounds.  Neurological: She is alert and oriented to person, place, and time.  Psychiatric: She has a normal mood and affect. Her behavior is normal.          Assessment & Plan:  Marland KitchenMarland KitchenBrylin was seen today for 4 month follow up.  Diagnoses and all orders for this visit:  Anxiety and depression -     venlafaxine XR (EFFEXOR-XR) 37.5 MG 24 hr capsule; Take 1 capsule (37.5 mg total) by mouth  daily with breakfast. -     COMPLETE METABOLIC PANEL WITH GFR  Morbidly obese (HCC)  PCOS (polycystic ovarian syndrome) -     spironolactone (ALDACTONE) 100 MG tablet; Take 1 tablet (100 mg total) by mouth daily.  Excessive hair growth -     spironolactone (ALDACTONE) 100 MG tablet; Take 1 tablet (100 mg total) by mouth daily.  Medication side effect   .[name] Depression screen [facility] 2/9 04/07/2017 11/26/2016 08/09/2016  Decreased Interest 2 1 1   Down, Depressed, Hopeless 0 0 1  PHQ - 2 Score 2 1 2   Altered sleeping 2 0 1  Tired, decreased energy 0 1 1  Change in appetite 0 0 2  Feeling bad or failure about yourself  0 1 2  Trouble concentrating 0 0 0  Moving slowly or fidgety/restless 0 1 0  Suicidal thoughts 0 0 0  PHQ-9 Score 4 4 8   Difficult doing work/chores Not difficult at all Somewhat difficult -   .. GAD 7 : Generalized Anxiety Score 04/07/2017 11/26/2016 08/09/2016  Nervous, Anxious, on Edge 0 0 1  Control/stop worrying 0 - 1  Worry too much - different things 0 0 1  Trouble relaxing 0 0 1  Restless 0 0 2  Easily annoyed or irritable 0 0 1  Afraid - awful might happen 0 0 1  Total GAD 7 Score 0 - 8  Anxiety Difficulty Not difficult at all Not difficult at all -   Refilled effexor. Check cmp for medication management. Certainly we could try another medication if paresthesia gets too bad. Pt aware.   Discussed weight. Pt will consider bariatric referral once husband switches job and gets better insurance.    Given cmp. Explained spirolactone is a potassium sparing diuretic and should not cause depletion of potassium. Make sure she is staying hydrated.   Follow up CPE August 2019.

## 2017-04-10 DIAGNOSIS — T887XXA Unspecified adverse effect of drug or medicament, initial encounter: Secondary | ICD-10-CM | POA: Insufficient documentation

## 2017-10-06 ENCOUNTER — Encounter: Payer: Self-pay | Admitting: Physician Assistant

## 2017-10-06 ENCOUNTER — Encounter: Payer: Self-pay | Admitting: Advanced Practice Midwife

## 2017-10-06 ENCOUNTER — Ambulatory Visit (INDEPENDENT_AMBULATORY_CARE_PROVIDER_SITE_OTHER): Payer: Managed Care, Other (non HMO) | Admitting: Physician Assistant

## 2017-10-06 ENCOUNTER — Encounter (INDEPENDENT_AMBULATORY_CARE_PROVIDER_SITE_OTHER): Payer: Managed Care, Other (non HMO) | Admitting: Advanced Practice Midwife

## 2017-10-06 VITALS — BP 131/73 | HR 76 | Ht 69.0 in | Wt 302.0 lb

## 2017-10-06 DIAGNOSIS — E7800 Pure hypercholesterolemia, unspecified: Secondary | ICD-10-CM

## 2017-10-06 DIAGNOSIS — Z131 Encounter for screening for diabetes mellitus: Secondary | ICD-10-CM

## 2017-10-06 DIAGNOSIS — G478 Other sleep disorders: Secondary | ICD-10-CM

## 2017-10-06 DIAGNOSIS — R0681 Apnea, not elsewhere classified: Secondary | ICD-10-CM

## 2017-10-06 DIAGNOSIS — R5383 Other fatigue: Secondary | ICD-10-CM

## 2017-10-06 DIAGNOSIS — D229 Melanocytic nevi, unspecified: Secondary | ICD-10-CM

## 2017-10-06 DIAGNOSIS — R0683 Snoring: Secondary | ICD-10-CM | POA: Diagnosis not present

## 2017-10-06 DIAGNOSIS — Z0000 Encounter for general adult medical examination without abnormal findings: Secondary | ICD-10-CM

## 2017-10-06 DIAGNOSIS — E559 Vitamin D deficiency, unspecified: Secondary | ICD-10-CM | POA: Diagnosis not present

## 2017-10-06 DIAGNOSIS — R079 Chest pain, unspecified: Secondary | ICD-10-CM

## 2017-10-06 NOTE — Patient Instructions (Signed)

## 2017-10-06 NOTE — Progress Notes (Signed)
Subjective:    Patient ID: Melanie Perez, female    DOB: 18-Nov-1980, 37 y.o.   MRN: [id number]  HPI  Pt is a 37 yo morbidly obese female who presents to the clinic for CPE.   On 10/03/17 she had an event of chest pain that persisted all day long. She works at a cardiology office. They did EKG per patient but I cannot see results. Never had anything like this before. CP with radiation down left arm.   She is very frustrated with her weight and energy level. She continues to gain weight. She has struggled with weight her entire life. She has tried nutri system, Physiological scientist, phentermine, belviq, contrave, saxenda, weight watchers, exercise. She can lose weight but then gains it back.   She does admit to snoring and waking up feeling not rested.    .. Active Ambulatory Problems    Diagnosis Date Noted  . PCOS (polycystic ovarian syndrome) 11/07/2012  . Fibroids 02/11/2013  . Abnormal weight gain 03/20/2013  . Acne 03/20/2013  . Venous insufficiency of left leg 04/24/2013  . History of DVT of lower extremity 04/24/2013  . Varicose veins 04/24/2013  . Superficial thrombophlebitis 05/02/2013  . Superficial thrombophlebitis of left leg 05/02/2013  . Varicose veins of lower extremities with other complications 64/40/3474  . BMI 34.0-34.9,adult 07/26/2013  . DVT (deep venous thrombosis) ([place]) 10/24/2013  . Excessive hair growth 01/12/2014  . S/P laparoscopic cholecystectomy 02/19/2014  . Abnormal uterine bleeding (AUB) 02/24/2015  . Vitamin D deficiency 10/08/2015  . Anxiety and depression 11/26/2016  . Morbid obesity ([place]) 11/26/2016  . Medication side effect 04/10/2017  . Suspicious nevus 10/06/2017  . No energy 10/09/2017  . Non-restorative sleep 10/09/2017  . Snoring 10/09/2017  . Apnea 10/09/2017  . Left-sided chest pain 10/09/2017   Resolved Ambulatory Problems    Diagnosis Date Noted  . BMI 37.0-37.9, adult 03/20/2013   Past Medical History:  Diagnosis Date  .  Abnormal Pap smear   . HPV in female   . Varicose veins    .[name] Family History  Problem Relation Age of Onset  . Kidney disease Maternal Grandfather   . Hypertension Maternal Grandfather   . Coronary artery disease Maternal Grandfather   . Fibromyalgia Mother    .[name] Social History   Socioeconomic History  . Marital status: Married    Spouse name: Not on file  . Number of children: Not on file  . Years of education: Not on file  . Highest education level: Not on file  Occupational History  . Occupation: Dance movement psychotherapist  Social Needs  . Financial resource strain: Not on file  . Food insecurity:    Worry: Not on file    Inability: Not on file  . Transportation needs:    Medical: Not on file    Non-medical: Not on file  Tobacco Use  . Smoking status: Never Smoker  . Smokeless tobacco: Never Used  Substance and Sexual Activity  . Alcohol use: No  . Drug use: No  . Sexual activity: Yes    Partners: Male    Birth control/protection: Condom  Lifestyle  . Physical activity:    Days per week: Not on file    Minutes per session: Not on file  . Stress: Not on file  Relationships  . Social connections:    Talks on phone: Not on file    Gets together: Not on file    Attends religious service: Not on file  Active member of club or organization: Not on file    Attends meetings of clubs or organizations: Not on file    Relationship status: Not on file  . Intimate partner violence:    Fear of current or ex partner: Not on file    Emotionally abused: Not on file    Physically abused: Not on file    Forced sexual activity: Not on file  Other Topics Concern  . Not on file  Social History Narrative  . Not on file     Review of Systems  All other systems reviewed and are negative.      Objective:   Physical Exam  Constitutional: She is oriented to person, place, and time. She appears well-developed and well-nourished.  HENT:  Head: Normocephalic and atraumatic.  Right  Ear: External ear normal.  Left Ear: External ear normal.  Nose: Nose normal.  Mouth/Throat: Oropharynx is clear and moist. No oropharyngeal exudate.  Eyes: Pupils are equal, round, and reactive to light. Conjunctivae and EOM are normal.  Neck: Normal range of motion. Neck supple. No thyromegaly present.  Cardiovascular: Normal rate and regular rhythm.  Pulmonary/Chest: Effort normal and breath sounds normal.  Abdominal: Soft. Bowel sounds are normal.  Lymphadenopathy:    She has no cervical adenopathy.  Neurological: She is alert and oriented to person, place, and time.  Skin:     Psychiatric: She has a normal mood and affect. Her behavior is normal.          Assessment & Plan:  Marland KitchenMarland KitchenNoriko was seen today for annual exam.  Diagnoses and all orders for this visit:  Routine physical examination -     Lipid Panel w/reflex Direct LDL -     COMPLETE METABOLIC PANEL WITH GFR -     TSH -     VITAMIN D 25 Hydroxy (Vit-D Deficiency, Fractures) -     B12 and Folate Panel -     CBC -     Ferritin  Morbid obesity (HCC) -     Home sleep test -     Amb Referral to Bariatric Surgery  Vitamin D deficiency -     VITAMIN D 25 Hydroxy (Vit-D Deficiency, Fractures)  Elevated LDL cholesterol level -     Lipid Panel w/reflex Direct LDL  Screening for diabetes mellitus -     COMPLETE METABOLIC PANEL WITH GFR  No energy -     TSH -     VITAMIN D 25 Hydroxy (Vit-D Deficiency, Fractures) -     B12 and Folate Panel -     CBC -     Ferritin -     Home sleep test  Non-restorative sleep -     Home sleep test  Apnea -     Home sleep test  Snoring -     Home sleep test  Suspicious nevus  Left-sided chest pain -     Exercise Tolerance Test  .. Depression screen [facility] 2/9 10/06/2017 04/07/2017 11/26/2016 08/09/2016  Decreased Interest 1 2 1 1   Down, Depressed, Hopeless 1 0 0 1  PHQ - 2 Score 2 2 1 2   Altered sleeping 3 2 0 1  Tired, decreased energy 2 0 1 1  Change in appetite 1 0  0 2  Feeling bad or failure about yourself  1 0 1 2  Trouble concentrating 0 0 0 0  Moving slowly or fidgety/restless 0 0 1 0  Suicidal thoughts 0 0 0 0  PHQ-9 Score 9 4 4 8   Difficult doing work/chores Not difficult at all Not difficult at all Somewhat difficult -   .[name] Discussed 150 minutes of exercise a week.  Encouraged vitamin D 1000 units and Calcium 1300mg  or 4 servings of dairy a day.  Pt has appt today with GYN.  Fasting labs ordered today.   Labs ordered to look at energy level. Certainly sleep apnea could cause some fatigue.  STOP Bang high risk with 4 yes. Will order home sleep test.  Weight is a target issue currently. She has tried numerous things see above. Will refer to bariatrics.   I think we should proceed with stress test to evaluate CP. Will order. Follow up with any new worsening symptoms.   Discussed mole that meets ABCD criteria. Come back for removal and biopsy.

## 2017-10-07 LAB — COMPLETE METABOLIC PANEL WITH GFR
AG RATIO: 1.4 (calc) (ref 1.0–2.5)
ALKALINE PHOSPHATASE (APISO): 93 U/L (ref 33–115)
ALT: 34 U/L — ABNORMAL HIGH (ref 6–29)
AST: 25 U/L (ref 10–30)
Albumin: 4.2 g/dL (ref 3.6–5.1)
BILIRUBIN TOTAL: 0.6 mg/dL (ref 0.2–1.2)
BUN: 15 mg/dL (ref 7–25)
CO2: 26 mmol/L (ref 20–32)
Calcium: 9.2 mg/dL (ref 8.6–10.2)
Chloride: 101 mmol/L (ref 98–110)
Creat: 0.82 mg/dL (ref 0.50–1.10)
GFR, Est African American: 106 mL/min/{1.73_m2} (ref >=60)
GFR, Est Non African American: 91 mL/min/{1.73_m2} (ref >=60)
GLOBULIN: 3 g/dL (ref 1.9–3.7)
Glucose, Bld: 83 mg/dL (ref 65–99)
Potassium: 4.3 mmol/L (ref 3.5–5.3)
SODIUM: 137 mmol/L (ref 135–146)
Total Protein: 7.2 g/dL (ref 6.1–8.1)

## 2017-10-07 LAB — FERRITIN: FERRITIN: 59 ng/mL (ref 16–154)

## 2017-10-07 LAB — B12 AND FOLATE PANEL
FOLATE: 18.6 ng/mL
Vitamin B-12: 623 pg/mL (ref 200–1100)

## 2017-10-07 LAB — LIPID PANEL W/REFLEX DIRECT LDL
Cholesterol: 252 mg/dL — ABNORMAL HIGH (ref <200)
HDL: 49 mg/dL — ABNORMAL LOW (ref >50)
LDL CHOLESTEROL (CALC): 175 mg/dL — AB
NON-HDL CHOLESTEROL (CALC): 203 mg/dL — AB (ref <130)
Total CHOL/HDL Ratio: 5.1 (calc) — ABNORMAL HIGH (ref <5.0)
Triglycerides: 138 mg/dL (ref <150)

## 2017-10-07 LAB — VITAMIN D 25 HYDROXY (VIT D DEFICIENCY, FRACTURES): Vit D, 25-Hydroxy: 29 ng/mL — ABNORMAL LOW (ref 30–100)

## 2017-10-07 LAB — CBC
HCT: 44.8 % (ref 35.0–45.0)
Hemoglobin: 14.9 g/dL (ref 11.7–15.5)
MCH: 28.7 pg (ref 27.0–33.0)
MCHC: 33.3 g/dL (ref 32.0–36.0)
MCV: 86.3 fL (ref 80.0–100.0)
MPV: 9.5 fL (ref 7.5–12.5)
Platelets: 362 10*3/uL (ref 140–400)
RBC: 5.19 10*6/uL — ABNORMAL HIGH (ref 3.80–5.10)
RDW: 13.9 % (ref 11.0–15.0)
WBC: 9.9 10*3/uL (ref 3.8–10.8)

## 2017-10-07 LAB — TSH: TSH: 1.49 m[IU]/L

## 2017-10-07 NOTE — Progress Notes (Signed)
Error

## 2017-10-08 ENCOUNTER — Encounter: Payer: Self-pay | Admitting: Physician Assistant

## 2017-10-09 ENCOUNTER — Encounter: Payer: Self-pay | Admitting: Physician Assistant

## 2017-10-09 DIAGNOSIS — G478 Other sleep disorders: Secondary | ICD-10-CM | POA: Insufficient documentation

## 2017-10-09 DIAGNOSIS — R079 Chest pain, unspecified: Secondary | ICD-10-CM | POA: Insufficient documentation

## 2017-10-09 DIAGNOSIS — R0683 Snoring: Secondary | ICD-10-CM | POA: Insufficient documentation

## 2017-10-09 DIAGNOSIS — R5383 Other fatigue: Secondary | ICD-10-CM | POA: Insufficient documentation

## 2017-10-09 DIAGNOSIS — R0681 Apnea, not elsewhere classified: Secondary | ICD-10-CM | POA: Insufficient documentation

## 2017-10-10 ENCOUNTER — Encounter: Payer: Self-pay | Admitting: Physician Assistant

## 2017-10-10 DIAGNOSIS — R748 Abnormal levels of other serum enzymes: Secondary | ICD-10-CM | POA: Insufficient documentation

## 2017-10-10 DIAGNOSIS — E785 Hyperlipidemia, unspecified: Secondary | ICD-10-CM | POA: Insufficient documentation

## 2017-10-10 NOTE — Progress Notes (Signed)
Call pt: no anemia, iron stores look good. b12 looks great. Vitamin D still low but not like 2 years ago. I would start d3 2000 units daily. Thyroid great. Sugars look good. Cholesterol has bumped up quite a bit. We need to get this down to lower your CV risk. I am hoping weight loss will do this. One liver enzyme is up as well. Likely due to fat accumulation around liver. Hold any tylenol products and alcohol and recheck in 2 weeks.

## 2017-10-11 ENCOUNTER — Other Ambulatory Visit: Payer: Self-pay

## 2017-10-11 DIAGNOSIS — R748 Abnormal levels of other serum enzymes: Secondary | ICD-10-CM

## 2017-10-18 ENCOUNTER — Encounter: Payer: Self-pay | Admitting: Physician Assistant

## 2017-11-10 ENCOUNTER — Other Ambulatory Visit: Payer: Self-pay

## 2017-11-10 DIAGNOSIS — R748 Abnormal levels of other serum enzymes: Secondary | ICD-10-CM

## 2017-11-10 LAB — ALT: ALT: 40 U/L — ABNORMAL HIGH (ref 6–29)

## 2017-11-12 ENCOUNTER — Other Ambulatory Visit: Payer: Self-pay | Admitting: Physician Assistant

## 2017-11-12 DIAGNOSIS — R748 Abnormal levels of other serum enzymes: Secondary | ICD-10-CM

## 2017-11-12 NOTE — Progress Notes (Signed)
Call pt: liver enzyme increase a little. Will order liver u/s.

## 2017-11-13 ENCOUNTER — Encounter: Payer: Self-pay | Admitting: Physician Assistant

## 2017-11-13 NOTE — Telephone Encounter (Signed)
Called Pt, advised her to get evaluation for possible DVT now. 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

## 2017-11-13 NOTE — Telephone Encounter (Signed)
I am not sure what note the patient might be talking about.  For any new symptoms, she needs to be seen here or elsewhere, I am not going to manage a potential DVT over phone/email.  Urgent care visit is fine if we don't have appointments today.

## 2017-11-20 ENCOUNTER — Ambulatory Visit (HOSPITAL_COMMUNITY)
Admission: RE | Admit: 2017-11-20 | Discharge: 2017-11-20 | Disposition: A | Discharge status: Home / Self Care | Payer: Managed Care, Other (non HMO) | Source: Ambulatory Visit | Attending: Physician Assistant | Admitting: Physician Assistant

## 2017-11-20 DIAGNOSIS — R748 Abnormal levels of other serum enzymes: Secondary | ICD-10-CM | POA: Diagnosis present

## 2017-11-20 NOTE — Progress Notes (Signed)
Call pt: liver looks good. No concerns.

## 2017-11-21 ENCOUNTER — Ambulatory Visit (HOSPITAL_BASED_OUTPATIENT_CLINIC_OR_DEPARTMENT_OTHER): Payer: Managed Care, Other (non HMO) | Attending: Physician Assistant | Admitting: Internal Medicine

## 2017-11-21 VITALS — Ht 69.0 in | Wt 302.0 lb

## 2017-11-21 DIAGNOSIS — Z6841 Body Mass Index (BMI) 40.0 and over, adult: Secondary | ICD-10-CM | POA: Diagnosis not present

## 2017-11-21 DIAGNOSIS — R0683 Snoring: Secondary | ICD-10-CM | POA: Diagnosis present

## 2017-11-21 DIAGNOSIS — G4733 Obstructive sleep apnea (adult) (pediatric): Secondary | ICD-10-CM

## 2017-11-21 DIAGNOSIS — R5383 Other fatigue: Secondary | ICD-10-CM | POA: Insufficient documentation

## 2017-11-21 DIAGNOSIS — G478 Other sleep disorders: Secondary | ICD-10-CM | POA: Insufficient documentation

## 2017-11-24 DIAGNOSIS — G4733 Obstructive sleep apnea (adult) (pediatric): Secondary | ICD-10-CM | POA: Diagnosis not present

## 2017-11-24 NOTE — Procedures (Signed)
    Patient Name: Melanie Perez, Melanie Perez Date: 11/21/2017 Gender: Female D.O.B: 1980/04/28 Age (years): 36 Referring Provider: Iran Planas Height (inches): 43 Interpreting Physician: Baird Lyons MD, ABSM Weight (lbs): 302 RPSGT: Jacolyn Reedy BMI: 45 MRN: [id number] Neck Size: 17.00  CLINICAL INFORMATION Sleep Study Type: HST Indication for sleep study: Snoring  Epworth Sleepiness Score: 7  SLEEP STUDY TECHNIQUE A multi-channel overnight portable sleep study was performed. The channels recorded were: nasal airflow, thoracic respiratory movement, and oxygen saturation with a pulse oximetry. Snoring was also monitored.  MEDICATIONS Patient self administered medications include: none reported.  SLEEP ARCHITECTURE Patient was studied for 471.2 minutes. The sleep efficiency was 99.3 % and the patient was supine for 61.4%. The arousal index was 0.0 per hour.  RESPIRATORY PARAMETERS The overall AHI was 8.9 per hour, with a central apnea index of 0.0 per hour.  The oxygen nadir was 84% during sleep.  CARDIAC DATA Mean heart rate during sleep was 70.8 bpm.  IMPRESSIONS - Mild obstructive sleep apnea occurred during this study (AHI = 8.9/h). - No significant central sleep apnea occurred during this study (CAI = 0.0/h). - Moderate oxygen desaturation was noted during this study (Min O2 = 84%). - Patient snored.  DIAGNOSIS - Obstructive Sleep Apnea (327.23 [G47.33 ICD-10])  RECOMMENDATIONS - Treatment for mild OSA is directed at symptoms. Conservative measures may include observation, weight loss, sleep position off back. Other options including CPAP or a fitted oral appliance may be based on clinical judgment. - Avoid alcohol, sedatives and other CNS depressants that may worsen sleep apnea and disrupt normal sleep architecture. - Sleep hygiene should be reviewed to assess factors that may improve sleep quality. - Weight management and regular exercise should be initiated  or continued.  [Electronically signed] 11/24/2017 01:27 PM  Baird Lyons MD, [place], American Board of Sleep Medicine   NPI: [id number]                         [place] [place] [facility]  ELECTRONICALLY SIGNED ON:  11/24/2017, 1:25 PM [place] PH: [phone]   FX: [phone] [place]

## 2017-11-25 ENCOUNTER — Encounter: Payer: Self-pay | Admitting: Physician Assistant

## 2017-11-25 ENCOUNTER — Other Ambulatory Visit: Payer: Self-pay | Admitting: Physician Assistant

## 2017-11-25 DIAGNOSIS — G4733 Obstructive sleep apnea (adult) (pediatric): Secondary | ICD-10-CM | POA: Insufficient documentation

## 2017-11-25 NOTE — Progress Notes (Signed)
Call pt: home test did show some mild sleep apnea. Conservative measures such as weight loss, sleeping off back could help or we can get you a CPAP. What are your thoughts?   Ok to send this note back to me.

## 2017-11-27 ENCOUNTER — Encounter: Payer: Self-pay | Admitting: Physician Assistant

## 2017-12-01 ENCOUNTER — Ambulatory Visit (INDEPENDENT_AMBULATORY_CARE_PROVIDER_SITE_OTHER): Payer: Managed Care, Other (non HMO) | Admitting: Certified Nurse Midwife

## 2017-12-01 ENCOUNTER — Encounter: Payer: Self-pay | Admitting: Certified Nurse Midwife

## 2017-12-01 VITALS — BP 125/79 | HR 93 | Ht 69.0 in | Wt 303.0 lb

## 2017-12-01 DIAGNOSIS — Z124 Encounter for screening for malignant neoplasm of cervix: Secondary | ICD-10-CM

## 2017-12-01 DIAGNOSIS — Z86018 Personal history of other benign neoplasm: Secondary | ICD-10-CM

## 2017-12-01 DIAGNOSIS — N912 Amenorrhea, unspecified: Secondary | ICD-10-CM

## 2017-12-01 DIAGNOSIS — Z8742 Personal history of other diseases of the female genital tract: Secondary | ICD-10-CM

## 2017-12-01 DIAGNOSIS — Z3202 Encounter for pregnancy test, result negative: Secondary | ICD-10-CM | POA: Diagnosis not present

## 2017-12-01 DIAGNOSIS — Z01419 Encounter for gynecological examination (general) (routine) without abnormal findings: Secondary | ICD-10-CM | POA: Diagnosis not present

## 2017-12-01 DIAGNOSIS — Z1151 Encounter for screening for human papillomavirus (HPV): Secondary | ICD-10-CM | POA: Diagnosis not present

## 2017-12-01 LAB — POCT URINE PREGNANCY: Preg Test, Ur: NEGATIVE

## 2017-12-01 NOTE — Progress Notes (Signed)
Pt states that she hasn't had period in 3 months- has negative pregnancy tests  Pt requests not to have another breast exam because PCP did in in June  Last pap smear 02/10/15- negative

## 2017-12-01 NOTE — Progress Notes (Signed)
Gynecology Annual Exam   History of Present Illness: Patient is a 37 y.o. [id number] presents for annual exam. The patient has c/o amenorrhea, no menses in almost 3 mos. The patient is sexually active. She denies dyspareunia. The patient does perform self breast exams. There is no notable family history of breast or ovarian cancer in her family. The patient denies current symptoms of depression. She is concerned about the amenorrhea because she was having regular menses. She has had several negative HPT.   Past Medical History:  Past Medical History:  Diagnosis Date  . Abnormal Pap smear    coplposcopy  . History of DVT (deep vein thrombosis)   . History of PCOS   . History of uterine fibroid   . HPV in female   . Varicose veins     Past Surgical History:  Past Surgical History:  Procedure Laterality Date  . BUNIONECTOMY    . lithropsy    . VARICOSE VEIN SURGERY      Gynecologic History:  LMP: Patient's last menstrual period was 08/31/2017. Average Interval: regular Heavy Menses: no Clots: no Intermenstrual Bleeding: no Postcoital Bleeding: no Dysmenorrhea: no Contraception: condoms Last Pap: completed on 02/10/15 ; result was: NIL and HR HPV negative  Mammogram: last completed on n/a  Obstetric History: [id number]  Family History:  Family History  Problem Relation Age of Onset  . Kidney disease Maternal Grandfather   . Hypertension Maternal Grandfather   . Coronary artery disease Maternal Grandfather   . Fibromyalgia Mother     Social History:  Social History   Socioeconomic History  . Marital status: Married    Spouse name: Not on file  . Number of children: Not on file  . Years of education: Not on file  . Highest education level: Not on file  Occupational History  . Occupation: Dance movement psychotherapist  Social Needs  . Financial resource strain: Not on file  . Food insecurity:    Worry: Not on file    Inability: Not on file  . Transportation needs:    Medical: Not on  file    Non-medical: Not on file  Tobacco Use  . Smoking status: Never Smoker  . Smokeless tobacco: Never Used  Substance and Sexual Activity  . Alcohol use: No  . Drug use: No  . Sexual activity: Yes    Partners: Male    Birth control/protection: Condom  Lifestyle  . Physical activity:    Days per week: Not on file    Minutes per session: Not on file  . Stress: Not on file  Relationships  . Social connections:    Talks on phone: Not on file    Gets together: Not on file    Attends religious service: Not on file    Active member of club or organization: Not on file    Attends meetings of clubs or organizations: Not on file    Relationship status: Not on file  . Intimate partner violence:    Fear of current or ex partner: Not on file    Emotionally abused: Not on file    Physically abused: Not on file    Forced sexual activity: Not on file  Other Topics Concern  . Not on file  Social History Narrative  . Not on file    Allergies:  Allergies  Allergen Reactions  . Contrave [Naltrexone-Bupropion Hcl Er] Anaphylaxis    Severe headaches  . Belviq [Lorcaserin Hcl]     fatigued  Medications: Prior to Admission medications   Medication Sig Start Date End Date Taking? Authorizing Provider  spironolactone (ALDACTONE) 100 MG tablet Take 1 tablet (100 mg total) by mouth daily. 04/07/17  Yes Breeback, Jade L, PA-C  venlafaxine XR (EFFEXOR-XR) 37.5 MG 24 hr capsule Take 1 capsule (37.5 mg total) by mouth daily with breakfast. 04/07/17  Yes Breeback, Jade L, PA-C    Review of Systems: negative except noted in HPI  Physical Exam Vitals: Blood pressure 125/79, pulse 93, height 5\' 9"  (1.753 m), weight (!) 137.4 kg, last menstrual period 08/31/2017. General: NAD HEENT: normocephalic, atraumatic Thyroid: no enlargement, no palpable nodules Pulmonary: Normal rate and effort, CTAB Cardiovascular: RRR Breast: declined- recently done by PCP Abdomen: soft, non-tender,  non-distended. No hepatomegaly, splenomegaly or masses palpable. No evidence of hernia  Genitourinary:  External: Normal external female genitalia. Normal urethral meatus  Vagina: Normal vaginal mucosa, no evidence of prolapse   Cervix: Grossly normal in appearance, no bleeding  Uterus: enlarged, mobile, normal contour. No CMT  Adnexa: non-enlarged, no masses  Rectal: deferred Extremities: no edema, erythema, or tenderness Neurologic: Grossly intact Psychiatric: mood appropriate, affect full  Female chaperone present for pelvic and breast portions of the physical exam  Assessment:  1. Well woman exam with routine gynecological exam   2. Amenorrhea   3. History of PCOS   4. History of uterine fibroid    Plan: Check PCOS labs- recent TSH nml, UPT neg today Pelvic US Follow up with GYN after [place] completed Consider medical mngt for amenorrhea- limited d/t DVT hx Consider medical mngt for PCOS Follow up with PCP as scheduled  Julianne Handler, CNM 12/01/2017 11:58 AM

## 2017-12-02 ENCOUNTER — Encounter: Payer: Self-pay | Admitting: Certified Nurse Midwife

## 2017-12-02 LAB — HEMOGLOBIN A1C
Hgb A1c MFr Bld: 5.2 % of total Hgb (ref <5.7)
Mean Plasma Glucose: 103 (calc)
eAG (mmol/L): 5.7 (calc)

## 2017-12-02 LAB — LUTEINIZING HORMONE: LH: 2.5 m[IU]/mL

## 2017-12-02 LAB — FOLLICLE STIMULATING HORMONE: FSH: 1.5 m[IU]/mL

## 2017-12-02 LAB — PROLACTIN: PROLACTIN: 14.2 ng/mL

## 2017-12-02 LAB — TESTOSTERONE: Testosterone: 26 ng/dL

## 2017-12-05 ENCOUNTER — Encounter (HOSPITAL_BASED_OUTPATIENT_CLINIC_OR_DEPARTMENT_OTHER): Payer: Self-pay

## 2017-12-05 ENCOUNTER — Ambulatory Visit (HOSPITAL_BASED_OUTPATIENT_CLINIC_OR_DEPARTMENT_OTHER)
Admission: RE | Admit: 2017-12-05 | Discharge: 2017-12-05 | Disposition: A | Discharge status: Home / Self Care | Payer: Managed Care, Other (non HMO) | Source: Ambulatory Visit | Attending: Certified Nurse Midwife | Admitting: Certified Nurse Midwife

## 2017-12-05 ENCOUNTER — Ambulatory Visit (HOSPITAL_BASED_OUTPATIENT_CLINIC_OR_DEPARTMENT_OTHER): Payer: Managed Care, Other (non HMO)

## 2017-12-05 DIAGNOSIS — Z8742 Personal history of other diseases of the female genital tract: Secondary | ICD-10-CM | POA: Diagnosis present

## 2017-12-05 DIAGNOSIS — D259 Leiomyoma of uterus, unspecified: Secondary | ICD-10-CM | POA: Diagnosis not present

## 2017-12-05 DIAGNOSIS — N912 Amenorrhea, unspecified: Secondary | ICD-10-CM | POA: Insufficient documentation

## 2017-12-05 LAB — CYTOLOGY - PAP
DIAGNOSIS: NEGATIVE
HPV (WINDOPATH): NOT DETECTED

## 2017-12-18 ENCOUNTER — Encounter: Payer: Self-pay | Admitting: Physician Assistant

## 2018-01-01 ENCOUNTER — Ambulatory Visit (INDEPENDENT_AMBULATORY_CARE_PROVIDER_SITE_OTHER): Payer: Managed Care, Other (non HMO) | Admitting: Obstetrics & Gynecology

## 2018-01-01 ENCOUNTER — Encounter: Payer: Self-pay | Admitting: Obstetrics & Gynecology

## 2018-01-01 VITALS — BP 140/74 | HR 97 | Ht 69.0 in | Wt 302.0 lb

## 2018-01-01 DIAGNOSIS — N912 Amenorrhea, unspecified: Secondary | ICD-10-CM | POA: Diagnosis not present

## 2018-01-01 DIAGNOSIS — Z8742 Personal history of other diseases of the female genital tract: Secondary | ICD-10-CM

## 2018-01-01 NOTE — Progress Notes (Signed)
Patient ID: Tashianna Broome, female   DOB: Sep 20, 1980, 37 y.o.   MRN: [id number]  No chief complaint on file.   HPI Liisa Picone is a 37 y.o. female.  Married P2 (17 and 39 yo kids) here for follow up after u/s done for evaluation of amenorrhea for 4 months. She has PCOS. Her u/s showed a 10 mm lining, right ovary c/w PCOS, left not seen, and several fibrodis. These fibroids have been present for years.   She has stress incontinence.  HPI  Past Medical History:  Diagnosis Date  . Abnormal Pap smear    coplposcopy  . History of DVT (deep vein thrombosis)   . History of PCOS   . History of uterine fibroid   . HPV in female   . Varicose veins     Past Surgical History:  Procedure Laterality Date  . BUNIONECTOMY    . lithropsy    . VARICOSE VEIN SURGERY      Family History  Problem Relation Age of Onset  . Kidney disease Maternal Grandfather   . Hypertension Maternal Grandfather   . Coronary artery disease Maternal Grandfather   . Fibromyalgia Mother     Social History Social History   Tobacco Use  . Smoking status: Never Smoker  . Smokeless tobacco: Never Used  Substance Use Topics  . Alcohol use: No  . Drug use: No    Allergies  Allergen Reactions  . Contrave [Naltrexone-Bupropion Hcl Er] Anaphylaxis    Severe headaches  . Belviq [Lorcaserin Hcl]     fatigued    Current Outpatient Medications  Medication Sig Dispense Refill  . spironolactone (ALDACTONE) 100 MG tablet Take 1 tablet (100 mg total) by mouth daily. 90 tablet 3  . venlafaxine XR (EFFEXOR-XR) 37.5 MG 24 hr capsule Take 1 capsule (37.5 mg total) by mouth daily with breakfast. 90 capsule 3   No current facility-administered medications for this visit.     Review of Systems Review of Systems Pap smear normal 8/19 She has an appt 01/15/18 to discuss a possible gastric bypass.  She uses condoms. 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   Physical Exam Physical Exam  Breathing, conversing, and ambulating normally Well nourished, well hydrated White female, no apparent distress Abd- benign  Data Reviewed Pap normal Ultrasound discussed  Assessment    Amenorrhea- probably due to PCOS- she cannot take OCPs due to her h/o DVT Discussed cyclic progestin, liletta versus TAH. I have rec'd medical management over surgery due to risks GSUI- she declines a referral to urologist Schedule IUD insertion, no unprotected IC for 2 weeks prior to insertion.     Plan           Emily Filbert 01/01/2018, 2:58 PM

## 2018-01-01 NOTE — Progress Notes (Signed)
Patient has not had period in four months. ,Kathrene Alu Rn

## 2018-01-15 ENCOUNTER — Ambulatory Visit (INDEPENDENT_AMBULATORY_CARE_PROVIDER_SITE_OTHER): Payer: Managed Care, Other (non HMO) | Admitting: Obstetrics & Gynecology

## 2018-01-15 ENCOUNTER — Encounter: Payer: Self-pay | Admitting: Obstetrics & Gynecology

## 2018-01-15 VITALS — BP 134/74 | HR 87 | Resp 16 | Ht 69.0 in | Wt 307.0 lb

## 2018-01-15 DIAGNOSIS — Z3202 Encounter for pregnancy test, result negative: Secondary | ICD-10-CM | POA: Diagnosis not present

## 2018-01-15 DIAGNOSIS — Z8742 Personal history of other diseases of the female genital tract: Secondary | ICD-10-CM

## 2018-01-15 DIAGNOSIS — Z30430 Encounter for insertion of intrauterine contraceptive device: Secondary | ICD-10-CM

## 2018-01-15 DIAGNOSIS — N912 Amenorrhea, unspecified: Secondary | ICD-10-CM

## 2018-01-15 LAB — POCT URINE PREGNANCY: Preg Test, Ur: NEGATIVE

## 2018-01-15 MED ORDER — LEVONORGESTREL 20 MCG/24HR IU IUD: INTRAUTERINE_SYSTEM | Freq: Once | INTRAUTERINE | Status: DC

## 2018-01-15 MED ORDER — LEVONORGESTREL 19.5 MCG/DAY IU IUD
INTRAUTERINE_SYSTEM | Freq: Once | INTRAUTERINE | Status: AC
  Administered 2018-01-15: 13:00:00 via INTRAUTERINE

## 2018-01-15 NOTE — Progress Notes (Signed)
   Subjective:    Patient ID: Melanie Perez, female    DOB: 20-Nov-1980, 37 y.o.   MRN: [id number]  HPI 37yo married P2 here for Liletta insertion. She has probable PCOS and amenorrhea for 4 months. She uses condoms prn. She has used Paragard in the past, had it removed about a year ago. She does not want more kids.   Review of Systems Pap smear normal 8/223/19    Objective:   Physical Exam Breathing, conversing, and ambulating normally Well nourished, well hydrated White female, no apparent distress Abd- benign UPT negative, consent signed, Time out procedure done. Cervix prepped with betadine and grasped with a single tooth tenaculum. Liletta was easily placed and the strings were cut to 3-4 cm. Uterus sounded to 9 cm. She tolerated the procedure well.     Assessment & Plan:  Contraception and protection of endometrium with probable PCOS- Liletta Come back 4 weeks for string check

## 2018-02-15 ENCOUNTER — Encounter: Payer: Self-pay | Admitting: Family Medicine

## 2018-02-15 ENCOUNTER — Ambulatory Visit (INDEPENDENT_AMBULATORY_CARE_PROVIDER_SITE_OTHER): Payer: Managed Care, Other (non HMO) | Admitting: Family Medicine

## 2018-02-15 VITALS — BP 126/74 | HR 92 | Resp 16 | Ht 69.0 in | Wt 308.0 lb

## 2018-02-15 DIAGNOSIS — Z30431 Encounter for routine checking of intrauterine contraceptive device: Secondary | ICD-10-CM

## 2018-02-15 NOTE — Progress Notes (Signed)
   Subjective:    Patient ID: Melanie Perez is a 37 y.o. female presenting with String Check (bleeding since placement)  on 02/15/2018  HPI: Here for IUD string check. Notes bleeding since IUD placed on 01/15/18. No pain.   Review of Systems  Constitutional: Negative for chills and fever.  Respiratory: Negative for shortness of breath.   Cardiovascular: Negative for chest pain.  Gastrointestinal: Negative for abdominal pain, nausea and vomiting.  Genitourinary: Positive for vaginal bleeding. Negative for dysuria.  Skin: Negative for rash.      Objective:    BP 126/74   Pulse 92   Resp 16   Ht 5\' 9"  (1.753 m)   Wt (!) 308 lb (139.7 kg)   BMI 45.48 kg/m  Physical Exam  Constitutional: She is oriented to person, place, and time. She appears well-developed and well-nourished. No distress.  HENT:  Head: Normocephalic and atraumatic.  Eyes: No scleral icterus.  Neck: Neck supple.  Cardiovascular: Normal rate.  Pulmonary/Chest: Effort normal.  Abdominal: Soft.  Genitourinary:  Genitourinary Comments: BUS normal, vagina is pink and rugated, cervix is parous without lesion, IUD strings noted  Neurological: She is alert and oriented to person, place, and time.  Skin: Skin is warm and dry.  Psychiatric: She has a normal mood and affect.        Assessment & Plan:  IUD check up - Normal bleeding profile reviewed.   Total face-to-face time with patient: 10 minutes. Over 50% of encounter was spent on counseling and coordination of care. Return if symptoms worsen or fail to improve.  Donnamae Jude 02/15/2018 5:54 PM

## 2018-03-01 ENCOUNTER — Encounter: Payer: Self-pay | Admitting: Physician Assistant

## 2018-03-10 ENCOUNTER — Encounter: Payer: Self-pay | Admitting: Physician Assistant

## 2018-04-02 ENCOUNTER — Encounter: Payer: Self-pay | Admitting: Physician Assistant

## 2018-04-02 ENCOUNTER — Telehealth: Payer: Self-pay

## 2018-04-02 ENCOUNTER — Ambulatory Visit (INDEPENDENT_AMBULATORY_CARE_PROVIDER_SITE_OTHER): Payer: Managed Care, Other (non HMO) | Admitting: Physician Assistant

## 2018-04-02 VITALS — BP 126/78 | HR 88 | Temp 98.2°F | Ht 69.0 in | Wt 305.0 lb

## 2018-04-02 DIAGNOSIS — R05 Cough: Secondary | ICD-10-CM

## 2018-04-02 DIAGNOSIS — J101 Influenza due to other identified influenza virus with other respiratory manifestations: Secondary | ICD-10-CM

## 2018-04-02 DIAGNOSIS — R059 Cough, unspecified: Secondary | ICD-10-CM

## 2018-04-02 LAB — POCT INFLUENZA A/B
INFLUENZA A, POC: NEGATIVE
INFLUENZA B, POC: POSITIVE — AB

## 2018-04-02 MED ORDER — BENZONATATE 200 MG PO CAPS: 200.0000 mg | ORAL_CAPSULE | Freq: Two times a day (BID) | ORAL | 0 refills | Status: DC | PRN

## 2018-04-02 MED ORDER — HYDROCOD POLST-CPM POLST ER 10-8 MG/5ML PO SUER: 5.0000 mL | Freq: Two times a day (BID) | ORAL | 0 refills | Status: DC | PRN

## 2018-04-02 MED ORDER — HYDROCODONE-HOMATROPINE 5-1.5 MG/5ML PO SYRP: 5.0000 mL | ORAL_SOLUTION | Freq: Two times a day (BID) | ORAL | 0 refills | Status: DC

## 2018-04-02 NOTE — Telephone Encounter (Signed)
Patient called and pharmacy is out of tussionex, but she said any alternative works. Thanks Luvenia Starch!

## 2018-04-02 NOTE — Telephone Encounter (Signed)
Sent hycodan to replace.

## 2018-04-02 NOTE — Patient Instructions (Signed)

## 2018-04-02 NOTE — Progress Notes (Addendum)
Subjective:    Patient ID: Melanie Perez, female    DOB: 1980-08-13, 37 y.o.   MRN: [id number]  HPI  Pt is a 37 yo female who presents to the clinic with 1 week of fatigue, cough, no energy, chills, sweats, body aches. Both her daughters were sick for 4 days but have gotten better. She has been taking tylenol cold and flu. Her cough is productive and her chest is tight when coughing. She has no energy. She has been working. She continues to have fever off and on and sweats. No wheezing.   .. Active Ambulatory Problems    Diagnosis Date Noted  . PCOS (polycystic ovarian syndrome) 11/07/2012  . Fibroids 02/11/2013  . Abnormal weight gain 03/20/2013  . Acne 03/20/2013  . Venous insufficiency of left leg 04/24/2013  . History of DVT of lower extremity 04/24/2013  . Varicose veins 04/24/2013  . Superficial thrombophlebitis 05/02/2013  . Superficial thrombophlebitis of left leg 05/02/2013  . Varicose veins of lower extremities with other complications 86/57/8469  . BMI 34.0-34.9,adult 07/26/2013  . DVT (deep venous thrombosis) ([place]) 10/24/2013  . Excessive hair growth 01/12/2014  . S/P laparoscopic cholecystectomy 02/19/2014  . Abnormal uterine bleeding (AUB) 02/24/2015  . Vitamin D deficiency 10/08/2015  . Anxiety and depression 11/26/2016  . Morbid obesity ([place]) 11/26/2016  . Medication side effect 04/10/2017  . Suspicious nevus 10/06/2017  . No energy 10/09/2017  . Non-restorative sleep 10/09/2017  . Snoring 10/09/2017  . Apnea 10/09/2017  . Left-sided chest pain 10/09/2017  . Dyslipidemia (high LDL; low HDL) 10/10/2017  . Elevated liver enzymes 10/10/2017  . Mild obstructive sleep apnea 11/25/2017   Resolved Ambulatory Problems    Diagnosis Date Noted  . BMI 37.0-37.9, adult 03/20/2013   Past Medical History:  Diagnosis Date  . Abnormal Pap smear   . History of DVT (deep vein thrombosis)   . History of PCOS   . History of uterine fibroid   . HPV in female        Review of Systems See HPI.     Objective:   Physical Exam Vitals signs reviewed.  Constitutional:      Appearance: Normal appearance.  HENT:     Head: Normocephalic and atraumatic.     Right Ear: Tympanic membrane and ear canal normal.     Left Ear: Tympanic membrane and ear canal normal.     Nose: Congestion present.     Mouth/Throat:     Mouth: Mucous membranes are moist.  Eyes:     Conjunctiva/sclera: Conjunctivae normal.  Cardiovascular:     Rate and Rhythm: Normal rate and regular rhythm.  Pulmonary:     Effort: Pulmonary effort is normal.     Breath sounds: Normal breath sounds.  Neurological:     General: No focal deficit present.     Mental Status: She is alert and oriented to person, place, and time.  Psychiatric:        Mood and Affect: Mood normal.        Behavior: Behavior normal.           Assessment & Plan:  Marland KitchenMarland KitchenDiagnoses and all orders for this visit:  Influenza B -     POCT Influenza A/B  Cough -     chlorpheniramine-HYDROcodone (TUSSIONEX) 10-8 MG/5ML SUER; Take 5 mLs by mouth every 12 (twelve) hours as needed for cough (cough, will cause drowsiness.). -     benzonatate (TESSALON) 200 MG capsule; Take 1 capsule (200 mg  total) by mouth 2 (two) times daily as needed for cough.  .. Results for orders placed or performed in visit on 04/02/18  POCT Influenza A/B  Result Value Ref Range   Influenza A, POC Negative Negative   Influenza B, POC Positive (A) Negative    pt is outside of window to treat with tamiflu.  Discussed symptomatic care.  tussionex at night.  Tessalon pearls during the day.  Continue with tylenol cold sinus severe.  Rest and hydrate.  Written out of work for tomorrow.

## 2018-04-05 NOTE — Telephone Encounter (Signed)
Thanks

## 2018-04-27 ENCOUNTER — Other Ambulatory Visit: Payer: Self-pay | Admitting: Physician Assistant

## 2018-04-27 DIAGNOSIS — F419 Anxiety disorder, unspecified: Principal | ICD-10-CM

## 2018-04-27 DIAGNOSIS — F329 Major depressive disorder, single episode, unspecified: Secondary | ICD-10-CM

## 2018-04-30 NOTE — Telephone Encounter (Signed)
I received an rx request for patient's Venlafaxine. Prescription has not been refilled since December of 2018. Patient was recently seen, but not for medication refills.   I wanted to check with you to see if refill is appropriate. I have pended medication for refill if appropriate. Thanks!

## 2018-06-08 ENCOUNTER — Ambulatory Visit: Payer: PRIVATE HEALTH INSURANCE | Admitting: Physician Assistant

## 2018-06-08 ENCOUNTER — Encounter: Payer: Self-pay | Admitting: Physician Assistant

## 2018-06-08 VITALS — BP 146/92 | HR 77 | Temp 98.4°F | Wt 310.0 lb

## 2018-06-08 DIAGNOSIS — I83893 Varicose veins of bilateral lower extremities with other complications: Secondary | ICD-10-CM

## 2018-06-08 DIAGNOSIS — F419 Anxiety disorder, unspecified: Secondary | ICD-10-CM | POA: Diagnosis not present

## 2018-06-08 DIAGNOSIS — R030 Elevated blood-pressure reading, without diagnosis of hypertension: Secondary | ICD-10-CM

## 2018-06-08 DIAGNOSIS — L689 Hypertrichosis, unspecified: Secondary | ICD-10-CM | POA: Diagnosis not present

## 2018-06-08 DIAGNOSIS — L700 Acne vulgaris: Secondary | ICD-10-CM

## 2018-06-08 DIAGNOSIS — F329 Major depressive disorder, single episode, unspecified: Secondary | ICD-10-CM

## 2018-06-08 MED ORDER — ADAPALENE-BENZOYL PEROXIDE 0.1-2.5 % EX GEL: 1.0000 "application " | Freq: Every day | CUTANEOUS | 3 refills | Status: DC

## 2018-06-08 MED ORDER — LIRAGLUTIDE -WEIGHT MANAGEMENT 18 MG/3ML ~~LOC~~ SOPN: 0.6000 mg | PEN_INJECTOR | Freq: Every day | SUBCUTANEOUS | 12 refills | Status: DC

## 2018-06-08 MED ORDER — VENLAFAXINE HCL ER 37.5 MG PO CP24: ORAL_CAPSULE | ORAL | 4 refills | Status: DC

## 2018-06-08 MED ORDER — SPIRONOLACTONE 50 MG PO TABS: 50.0000 mg | ORAL_TABLET | Freq: Every day | ORAL | 4 refills | Status: DC

## 2018-06-08 NOTE — Progress Notes (Signed)
Subjective:    Patient ID: Melanie Perez, female    DOB: 06/06/1980, 38 y.o.   MRN: [id number]  HPI  Patient is a 38 year old morbidly obese female with past medical history of bilateral varicose veins, DVT, PCOS, sleep apnea, anxiety and depression.  Patient needs refills on her medication.  Her anxiety and depression are doing great.  She has no problems or concerns.  She denies any suicidal thoughts or homicidal idealizations.  She continues to follow-up with vascular for her varicose veins.  They have suggested some procedures for her but she does not want to do these until she loses weight.  She is currently waiting to pursue gastric sleeve until she is work for [employer] for 3 years.  They will not pay until she is work for 3 years.  She is currently not on any medication for weight loss.  She is currently not exercising or making lifestyle changes.  Needs epiduo for acne. She has used in the past.   .. Active Ambulatory Problems    Diagnosis Date Noted  . PCOS (polycystic ovarian syndrome) 11/07/2012  . Fibroids 02/11/2013  . Abnormal weight gain 03/20/2013  . Acne 03/20/2013  . Venous insufficiency of left leg 04/24/2013  . History of DVT of lower extremity 04/24/2013  . Varicose veins 04/24/2013  . Superficial thrombophlebitis 05/02/2013  . Superficial thrombophlebitis of left leg 05/02/2013  . Varicose veins of bilateral lower extremities with other complications 85/46/2703  . BMI 34.0-34.9,adult 07/26/2013  . DVT (deep venous thrombosis) ([place]) 10/24/2013  . Excessive hair growth 01/12/2014  . S/P laparoscopic cholecystectomy 02/19/2014  . Abnormal uterine bleeding (AUB) 02/24/2015  . Vitamin D deficiency 10/08/2015  . Anxiety and depression 11/26/2016  . Morbid obesity ([place]) 11/26/2016  . Medication side effect 04/10/2017  . Suspicious nevus 10/06/2017  . No energy 10/09/2017  . Non-restorative sleep 10/09/2017  . Snoring 10/09/2017  . Apnea 10/09/2017  .  Left-sided chest pain 10/09/2017  . Dyslipidemia (high LDL; low HDL) 10/10/2017  . Elevated liver enzymes 10/10/2017  . Mild obstructive sleep apnea 11/25/2017   Resolved Ambulatory Problems    Diagnosis Date Noted  . BMI 37.0-37.9, adult 03/20/2013   Past Medical History:  Diagnosis Date  . Abnormal Pap smear   . History of DVT (deep vein thrombosis)   . History of PCOS   . History of uterine fibroid   . HPV in female      Review of Systems  All other systems reviewed and are negative.      Objective:   Physical Exam Vitals signs reviewed.  Constitutional:      Appearance: Normal appearance. She is obese.  HENT:     Head: Normocephalic and atraumatic.  Cardiovascular:     Rate and Rhythm: Normal rate and regular rhythm.  Pulmonary:     Effort: Pulmonary effort is normal.     Breath sounds: Normal breath sounds.  Skin:    Comments: Bilateral large varicose veins of both legs. Some painful.   Neurological:     General: No focal deficit present.     Mental Status: She is alert and oriented to person, place, and time.           Assessment & Plan:  Marland KitchenMarland KitchenYsela was seen today for medication management.  Diagnoses and all orders for this visit:  Anxiety and depression -     venlafaxine XR (EFFEXOR-XR) 37.5 MG 24 hr capsule; TAKE 1 CAPSULE BY MOUTH ONCE DAILY WITH BREAKFAST  Varicose veins of bilateral lower extremities with other complications -     spironolactone (ALDACTONE) 50 MG tablet; Take 1 tablet (50 mg total) by mouth daily.  Excessive hair growth -     spironolactone (ALDACTONE) 50 MG tablet; Take 1 tablet (50 mg total) by mouth daily.  Morbid obesity (H[facility]) -     Liraglutide -Weight Management (SAXENDA) 18 MG/3ML SOPN; Inject 0.6 mg into the skin daily. For one week then increase by .6mg  weekly until reaches 3mg  daily.  Please include ultra fine needles 71mm  Elevated blood pressure reading  Acne vulgaris -     Adapalene-Benzoyl Peroxide 0.1-2.5 %  gel; Apply 1 application topically at bedtime.  .. Depression screen [facility] 2/9 06/08/2018 10/06/2017 04/07/2017 11/26/2016 08/09/2016  Decreased Interest 0 1 2 1 1   Down, Depressed, Hopeless 0 1 0 0 1  PHQ - 2 Score 0 2 2 1 2   Altered sleeping 3 3 2  0 1  Tired, decreased energy 0 2 0 1 1  Change in appetite 0 1 0 0 2  Feeling bad or failure about yourself  0 1 0 1 2  Trouble concentrating 0 0 0 0 0  Moving slowly or fidgety/restless 0 0 0 1 0  Suicidal thoughts 0 0 0 0 0  PHQ-9 Score 3 9 4 4 8   Difficult doing work/chores Not difficult at all Not difficult at all Not difficult at all Somewhat difficult -   .. GAD 7 : Generalized Anxiety Score 06/08/2018 10/06/2017 04/07/2017 11/26/2016  Nervous, Anxious, on Edge 0 1 0 0  Control/stop worrying 0 1 0 -  Worry too much - different things 0 1 0 0  Trouble relaxing 0 1 0 0  Restless 0 1 0 0  Easily annoyed or irritable 0 1 0 0  Afraid - awful might happen 0 0 0 0  Total GAD 7 Score 0 6 0 -  Anxiety Difficulty Not difficult at all Not difficult at all Not difficult at all Not difficult at all    effexor refilled for one year.   Elevated blood pressure reading in clinic today. Reviewed last check and looked great. Per patient went to vascular surgeon recently and BP good. Will continue to monitor. Watch salt in diet. Continue to work on weight loss.   Varicose veins continue to follow up with vascular and work on weight loss.   She has to work for [employer] for 3 years before they will pay for gastric sleeve surgery. She has worked for 1 year in [place]. She continues to try to lose weight but she is gaining. She is not exercising or working on diet currently.   [name].Discussed low carb diet with 1500 calories and 80g of protein.  Exercising at least 150 minutes a week.  [employer] could be a Microbiologist.  Failed belviq. saxenda ordered.  Discussed side effects.  Coupon card given.  Discussed titration up to 3mg  daily.   Follow up in 2  months.   [name].Spent 30 minutes with patient and greater than 50 percent of visit spent counseling patient regarding treatment plan.

## 2018-06-11 ENCOUNTER — Encounter: Payer: Self-pay | Admitting: Physician Assistant

## 2018-12-21 ENCOUNTER — Ambulatory Visit: Payer: PRIVATE HEALTH INSURANCE | Admitting: Physician Assistant

## 2019-06-07 ENCOUNTER — Ambulatory Visit (INDEPENDENT_AMBULATORY_CARE_PROVIDER_SITE_OTHER): Payer: PRIVATE HEALTH INSURANCE | Admitting: Physician Assistant

## 2019-06-07 ENCOUNTER — Other Ambulatory Visit: Payer: Self-pay

## 2019-06-07 ENCOUNTER — Encounter: Payer: Self-pay | Admitting: Physician Assistant

## 2019-06-07 VITALS — BP 120/70 | HR 75 | Ht 69.0 in | Wt 261.0 lb

## 2019-06-07 DIAGNOSIS — Z13220 Encounter for screening for lipoid disorders: Secondary | ICD-10-CM

## 2019-06-07 DIAGNOSIS — E6609 Other obesity due to excess calories: Secondary | ICD-10-CM

## 2019-06-07 DIAGNOSIS — Z6839 Body mass index (BMI) 39.0-39.9, adult: Secondary | ICD-10-CM

## 2019-06-07 DIAGNOSIS — Z1329 Encounter for screening for other suspected endocrine disorder: Secondary | ICD-10-CM

## 2019-06-07 DIAGNOSIS — F419 Anxiety disorder, unspecified: Secondary | ICD-10-CM

## 2019-06-07 DIAGNOSIS — I83893 Varicose veins of bilateral lower extremities with other complications: Secondary | ICD-10-CM

## 2019-06-07 DIAGNOSIS — F329 Major depressive disorder, single episode, unspecified: Secondary | ICD-10-CM

## 2019-06-07 DIAGNOSIS — L689 Hypertrichosis, unspecified: Secondary | ICD-10-CM

## 2019-06-07 DIAGNOSIS — Z0000 Encounter for general adult medical examination without abnormal findings: Secondary | ICD-10-CM

## 2019-06-07 DIAGNOSIS — Z131 Encounter for screening for diabetes mellitus: Secondary | ICD-10-CM

## 2019-06-07 MED ORDER — VENLAFAXINE HCL ER 37.5 MG PO CP24: ORAL_CAPSULE | ORAL | 4 refills | Status: DC

## 2019-06-07 MED ORDER — SAXENDA 18 MG/3ML ~~LOC~~ SOPN: 0.6000 mg | PEN_INJECTOR | Freq: Every day | SUBCUTANEOUS | 12 refills | Status: DC

## 2019-06-07 MED ORDER — SPIRONOLACTONE 50 MG PO TABS: 50.0000 mg | ORAL_TABLET | Freq: Every day | ORAL | 4 refills | Status: DC

## 2019-06-07 NOTE — Patient Instructions (Signed)
Health Maintenance, Female Adopting a healthy lifestyle and getting preventive care are important in promoting health and wellness. Ask your health care provider about:  The right schedule for you to have regular tests and exams.  Things you can do on your own to prevent diseases and keep yourself healthy. What should I know about diet, weight, and exercise? Eat a healthy diet   Eat a diet that includes plenty of vegetables, fruits, low-fat dairy products, and lean protein.  Do not eat a lot of foods that are high in solid fats, added sugars, or sodium. Maintain a healthy weight Body mass index (BMI) is used to identify weight problems. It estimates body fat based on height and weight. Your health care provider can help determine your BMI and help you achieve or maintain a healthy weight. Get regular exercise Get regular exercise. This is one of the most important things you can do for your health. Most adults should:  Exercise for at least 150 minutes each week. The exercise should increase your heart rate and make you sweat (moderate-intensity exercise).  Do strengthening exercises at least twice a week. This is in addition to the moderate-intensity exercise.  Spend less time sitting. Even light physical activity can be beneficial. Watch cholesterol and blood lipids Have your blood tested for lipids and cholesterol at 40 years of age, then have this test every 5 years. Have your cholesterol levels checked more often if:  Your lipid or cholesterol levels are high.  You are older than 40 years of age.  You are at high risk for heart disease. What should I know about cancer screening? Depending on your health history and family history, you may need to have cancer screening at various ages. This may include screening for:  Breast cancer.  Cervical cancer.  Colorectal cancer.  Skin cancer.  Lung cancer. What should I know about heart disease, diabetes, and high blood  pressure? Blood pressure and heart disease  High blood pressure causes heart disease and increases the risk of stroke. This is more likely to develop in people who have high blood pressure readings, are of African descent, or are overweight.  Have your blood pressure checked: ? Every 3-5 years if you are 18-39 years of age. ? Every year if you are 40 years old or older. Diabetes Have regular diabetes screenings. This checks your fasting blood sugar level. Have the screening done:  Once every three years after age 40 if you are at a normal weight and have a low risk for diabetes.  More often and at a younger age if you are overweight or have a high risk for diabetes. What should I know about preventing infection? Hepatitis B If you have a higher risk for hepatitis B, you should be screened for this virus. Talk with your health care provider to find out if you are at risk for hepatitis B infection. Hepatitis C Testing is recommended for:  Everyone born from 1945 through 1965.  Anyone with known risk factors for hepatitis C. Sexually transmitted infections (STIs)  Get screened for STIs, including gonorrhea and chlamydia, if: ? You are sexually active and are younger than 40 years of age. ? You are older than 40 years of age and your health care provider tells you that you are at risk for this type of infection. ? Your sexual activity has changed since you were last screened, and you are at increased risk for chlamydia or gonorrhea. Ask your health care provider if   you are at risk.  Ask your health care provider about whether you are at high risk for HIV. Your health care provider may recommend a prescription medicine to help prevent HIV infection. If you choose to take medicine to prevent HIV, you should first get tested for HIV. You should then be tested every 3 months for as long as you are taking the medicine. Pregnancy  If you are about to stop having your period (premenopausal) and  you may become pregnant, seek counseling before you get pregnant.  Take 400 to 800 micrograms (mcg) of folic acid every day if you become pregnant.  Ask for birth control (contraception) if you want to prevent pregnancy. Osteoporosis and menopause Osteoporosis is a disease in which the bones lose minerals and strength with aging. This can result in bone fractures. If you are 65 years old or older, or if you are at risk for osteoporosis and fractures, ask your health care provider if you should:  Be screened for bone loss.  Take a calcium or vitamin D supplement to lower your risk of fractures.  Be given hormone replacement therapy (HRT) to treat symptoms of menopause. Follow these instructions at home: Lifestyle  Do not use any products that contain nicotine or tobacco, such as cigarettes, e-cigarettes, and chewing tobacco. If you need help quitting, ask your health care provider.  Do not use street drugs.  Do not share needles.  Ask your health care provider for help if you need support or information about quitting drugs. Alcohol use  Do not drink alcohol if: ? Your health care provider tells you not to drink. ? You are pregnant, may be pregnant, or are planning to become pregnant.  If you drink alcohol: ? Limit how much you use to 0-1 drink a day. ? Limit intake if you are breastfeeding.  Be aware of how much alcohol is in your drink. In the [place] one drink equals one 12 oz bottle of beer (355 mL), one 5 oz glass of wine (148 mL), or one 1 oz glass of hard liquor (44 mL). General instructions  Schedule regular health, dental, and eye exams.  Stay current with your vaccines.  Tell your health care provider if: ? You often feel depressed. ? You have ever been abused or do not feel safe at home. Summary  Adopting a healthy lifestyle and getting preventive care are important in promoting health and wellness.  Follow your health care provider's instructions about healthy  diet, exercising, and getting tested or screened for diseases.  Follow your health care provider's instructions on monitoring your cholesterol and blood pressure. This information is not intended to replace advice given to you by your health care provider. Make sure you discuss any questions you have with your health care provider. Document Revised: 03/21/2018 Document Reviewed: 03/21/2018 Elsevier Patient Education  2020 [employer]  

## 2019-06-07 NOTE — Progress Notes (Signed)
Subjective:     Melanie Perez is a 39 y.o. female and is here for a comprehensive physical exam. The patient reports no problems.  Pt is doing great with weight loss. She has lost 51lbs. She is exercising regularly and doing great with cravings.   Social History   Socioeconomic History  . Marital status: Married    Spouse name: Not on file  . Number of children: Not on file  . Years of education: Not on file  . Highest education level: Not on file  Occupational History  . Occupation: Dance movement psychotherapist  Tobacco Use  . Smoking status: Never Smoker  . Smokeless tobacco: Never Used  Substance and Sexual Activity  . Alcohol use: No  . Drug use: No  . Sexual activity: Yes    Partners: Male    Birth control/protection: I.U.D.  Other Topics Concern  . Not on file  Social History Narrative  . Not on file   Social Determinants of Health   Financial Resource Strain:   . Difficulty of Paying Living Expenses: Not on file  Food Insecurity:   . Worried About Charity fundraiser in the Last Year: Not on file  . Ran Out of Food in the Last Year: Not on file  Transportation Needs:   . Lack of Transportation (Medical): Not on file  . Lack of Transportation (Non-Medical): Not on file  Physical Activity:   . Days of Exercise per Week: Not on file  . Minutes of Exercise per Session: Not on file  Stress:   . Feeling of Stress : Not on file  Social Connections:   . Frequency of Communication with Friends and Family: Not on file  . Frequency of Social Gatherings with Friends and Family: Not on file  . Attends Religious Services: Not on file  . Active Member of Clubs or Organizations: Not on file  . Attends Archivist Meetings: Not on file  . Marital Status: Not on file  Intimate Partner Violence:   . Fear of Current or Ex-Partner: Not on file  . Emotionally Abused: Not on file  . Physically Abused: Not on file  . Sexually Abused: Not on file   Health Maintenance  Topic Date Due   . PAP SMEAR-Modifier  12/01/2020  . TETANUS/TDAP  08/09/2025  . INFLUENZA VACCINE  Completed  . HIV Screening  Completed    The following portions of the patient's history were reviewed and updated as appropriate: allergies, current medications, past family history, past medical history, past social history, past surgical history and problem list.  Review of Systems Pertinent items noted in HPI and remainder of comprehensive ROS otherwise negative.   Objective:    BP 120/70   Pulse 75   Ht 5\' 9"  (1.753 m)   Wt 261 lb (118.4 kg)   SpO2 96%   BMI 38.54 kg/m  General appearance: alert, cooperative, appears stated age and moderately obese Head: Normocephalic, without obvious abnormality, atraumatic Eyes: conjunctivae/corneas clear. PERRL, EOM's intact. Fundi benign. Ears: normal TM's and external ear canals both ears Nose: Nares normal. Septum midline. Mucosa normal. No drainage or sinus tenderness. Throat: lips, mucosa, and tongue normal; teeth and gums normal Neck: no adenopathy, no carotid bruit, no JVD, supple, symmetrical, trachea midline and thyroid not enlarged, symmetric, no tenderness/mass/nodules Back: symmetric, no curvature. ROM normal. No CVA tenderness. Lungs: clear to auscultation bilaterally Breasts: normal appearance, no masses or tenderness, breast are very dense.  Heart: regular rate and rhythm, S1,  S2 normal, no murmur, click, rub or gallop Abdomen: soft, non-tender; bowel sounds normal; no masses,  no organomegaly Extremities: extremities normal, atraumatic, no cyanosis or edema and varicose veins noted Pulses: 2+ and symmetric Skin: Skin color, texture, turgor normal. No rashes or lesions Lymph nodes: Cervical, supraclavicular, and axillary nodes normal. Neurologic: Alert and oriented X 3, normal strength and tone. Normal symmetric reflexes. Normal coordination and gait   .[name] Depression screen Melanie Perez 2/9 06/07/2019 06/08/2018 10/06/2017 04/07/2017 11/26/2016   Decreased Interest 0 0 1 2 1   Down, Depressed, Hopeless 1 0 1 0 0  PHQ - 2 Score 1 0 2 2 1   Altered sleeping 1 3 3 2  0  Tired, decreased energy 2 0 2 0 1  Change in appetite 0 0 1 0 0  Feeling bad or failure about yourself  0 0 1 0 1  Trouble concentrating 0 0 0 0 0  Moving slowly or fidgety/restless 0 0 0 0 1  Suicidal thoughts 0 0 0 0 0  PHQ-9 Score 4 3 9 4 4   Difficult doing work/chores Not difficult at all Not difficult at all Not difficult at all Not difficult at all Somewhat difficult   .[name] GAD 7 : Generalized Anxiety Score 06/07/2019 06/08/2018 10/06/2017 04/07/2017  Nervous, Anxious, on Edge 1 0 1 0  Control/stop worrying 1 0 1 0  Worry too much - different things 1 0 1 0  Trouble relaxing 0 0 1 0  Restless 0 0 1 0  Easily annoyed or irritable 0 0 1 0  Afraid - awful might happen 0 0 0 0  Total GAD 7 Score 3 0 6 0  Anxiety Difficulty Not difficult at all Not difficult at all Not difficult at all Not difficult at all     Assessment:    Healthy female exam.      Plan:    Marland KitchenMarland KitchenPersaeus was seen today for annual exam.  Diagnoses and all orders for this visit:  Routine physical examination -     COMPLETE METABOLIC PANEL WITH GFR -     Lipid Panel w/reflex Direct LDL -     TSH -     CBC  Class 2 obesity due to excess calories without serious comorbidity with body mass index (BMI) of 39.0 to 39.9 in adult -     Liraglutide -Weight Management (SAXENDA) 18 MG/3ML SOPN; Inject 0.6 mg into the skin daily. For one week then increase by .6mg  weekly until reaches 3mg  daily.  Please include ultra fine needles 67mm  Anxiety and depression -     venlafaxine XR (EFFEXOR-XR) 37.5 MG 24 hr capsule; TAKE 1 CAPSULE BY MOUTH ONCE DAILY WITH BREAKFAST  Varicose veins of bilateral lower extremities with other complications -     spironolactone (ALDACTONE) 50 MG tablet; Take 1 tablet (50 mg total) by mouth daily.  Excessive hair growth -     spironolactone (ALDACTONE) 50 MG tablet; Take 1  tablet (50 mg total) by mouth daily.  Screening for diabetes mellitus -     COMPLETE METABOLIC PANEL WITH GFR  Screening for lipid disorders -     Lipid Panel w/reflex Direct LDL  Thyroid disorder screening -     TSH   .[name] Discussed 150 minutes of exercise a week.  Encouraged vitamin D 1000 units and Calcium 1300mg  or 4 servings of dairy a day.  Fasting labs ordered.  Pap up to date.  Mammogram after October.  Flu shot UTD.  Continue weight loss. Refilled saxenda.  Mood doing well. Continue effexor.  Continue to follow up with vascular for veins.  See After Visit Summary for Counseling Recommendations

## 2019-06-08 LAB — COMPLETE METABOLIC PANEL WITH GFR
AG Ratio: 1.6 (calc) (ref 1.0–2.5)
ALT: 28 U/L (ref 6–29)
AST: 21 U/L (ref 10–30)
Albumin: 4.1 g/dL (ref 3.6–5.1)
Alkaline phosphatase (APISO): 78 U/L (ref 31–125)
BUN: 11 mg/dL (ref 7–25)
CO2: 26 mmol/L (ref 20–32)
Calcium: 9 mg/dL (ref 8.6–10.2)
Chloride: 102 mmol/L (ref 98–110)
Creat: 0.74 mg/dL (ref 0.50–1.10)
GFR, Est African American: 118 mL/min/{1.73_m2} (ref >=60)
GFR, Est Non African American: 102 mL/min/{1.73_m2} (ref >=60)
Globulin: 2.6 g/dL (calc) (ref 1.9–3.7)
Glucose, Bld: 78 mg/dL (ref 65–99)
Potassium: 4.7 mmol/L (ref 3.5–5.3)
Sodium: 136 mmol/L (ref 135–146)
Total Bilirubin: 0.7 mg/dL (ref 0.2–1.2)
Total Protein: 6.7 g/dL (ref 6.1–8.1)

## 2019-06-08 LAB — CBC
HCT: 45.5 % — ABNORMAL HIGH (ref 35.0–45.0)
Hemoglobin: 15.5 g/dL (ref 11.7–15.5)
MCH: 30.7 pg (ref 27.0–33.0)
MCHC: 34.1 g/dL (ref 32.0–36.0)
MCV: 90.1 fL (ref 80.0–100.0)
MPV: 10.6 fL (ref 7.5–12.5)
Platelets: 290 10*3/uL (ref 140–400)
RBC: 5.05 10*6/uL (ref 3.80–5.10)
RDW: 13.1 % (ref 11.0–15.0)
WBC: 6 10*3/uL (ref 3.8–10.8)

## 2019-06-08 LAB — LIPID PANEL W/REFLEX DIRECT LDL
Cholesterol: 229 mg/dL — ABNORMAL HIGH (ref <200)
HDL: 41 mg/dL — ABNORMAL LOW (ref >=50)
LDL Cholesterol (Calc): 169 mg/dL (calc) — ABNORMAL HIGH
Non-HDL Cholesterol (Calc): 188 mg/dL (calc) — ABNORMAL HIGH (ref <130)
Total CHOL/HDL Ratio: 5.6 (calc) — ABNORMAL HIGH (ref <5.0)
Triglycerides: 89 mg/dL (ref <150)

## 2019-06-08 LAB — SPECIMEN COMPROMISED

## 2019-06-08 LAB — TSH: TSH: 1.65 mIU/L

## 2019-06-10 ENCOUNTER — Encounter: Payer: Self-pay | Admitting: Physician Assistant

## 2019-06-10 MED ORDER — ATORVASTATIN CALCIUM 40 MG PO TABS: 40.0000 mg | ORAL_TABLET | Freq: Every day | ORAL | 3 refills | Status: DC

## 2019-06-10 NOTE — Progress Notes (Signed)
Camey,   Kidney, liver, glucose look great. You are not anemic. Thyroid looks great. TG look good. Cholesterol still overall elevated. Overall CV risk is still low due to age, no DM, no BP issues. You have lost 50lbs and still don't see a significant shift in cholesterol. Likely this is more genetic. My suggestion would be to go on a low dose statin to decrease this risk and get cholesterol down. Thoughts?

## 2019-06-27 ENCOUNTER — Encounter: Payer: Self-pay | Admitting: Physician Assistant

## 2019-06-28 MED ORDER — HYDROCORTISONE VALERATE 0.2 % EX OINT: 1.0000 "application " | TOPICAL_OINTMENT | Freq: Two times a day (BID) | CUTANEOUS | 0 refills | Status: DC

## 2019-06-28 NOTE — Addendum Note (Signed)
Addended by: Donella Stade on: 06/28/2019 03:57 PM   Modules accepted: Orders

## 2019-10-19 IMAGING — US US LIVER ELASTOGRAPHY
1 series · 13 of 25 positions shown · non-contrast
Comparison: None available. Report only from abdominal CT
07/05/2011.

CLINICAL DATA: Elevated liver function studies.

EXAM:
US LIVER ELASTOGRAPHY
TECHNIQUE: Ultrasound elastography evaluation of the liver was performed. A
region of interest was placed in the right lobe of the liver.
Following application of a compressive sonographic pulse, shear
waves were detected in the adjacent hepatic tissue and the shear
wave velocity was calculated. Multiple assessments were performed at
the selected site. Median shear wave velocity is correlated to a
Metavir fibrosis score.

[Series 1: us liver elastography · 0.30mm/px · 13 of 40 slices shown]
[im 1/40]
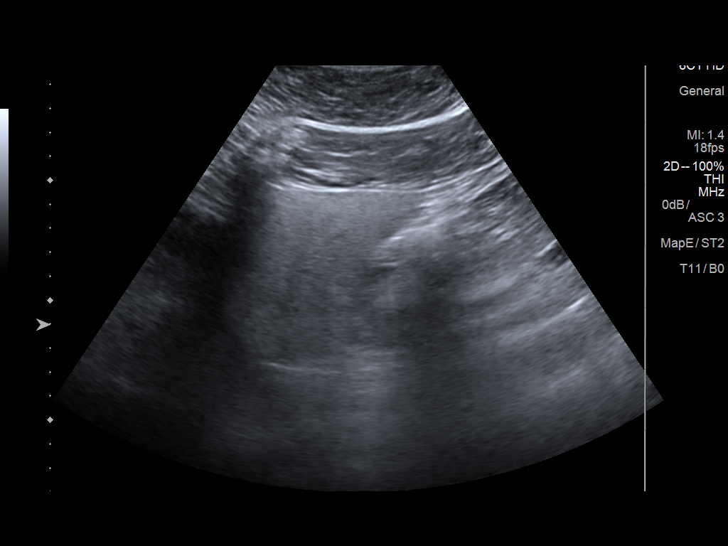
[im 4/40]
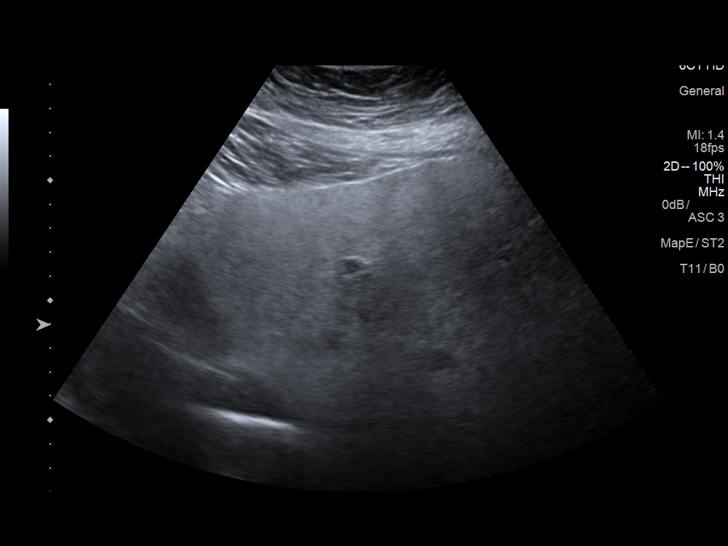
[im 7/40]
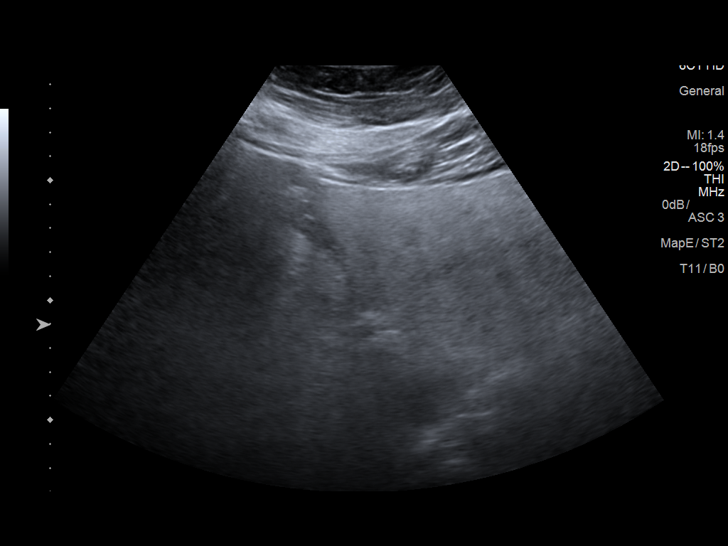
[im 10/40]
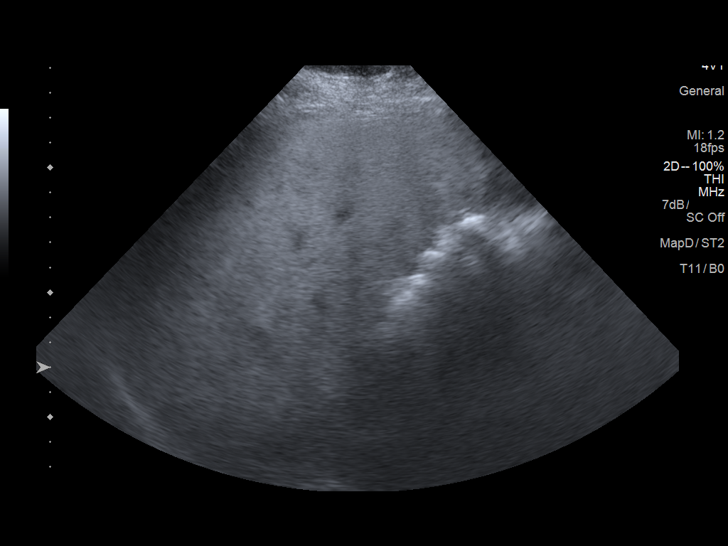
[im 14/40]
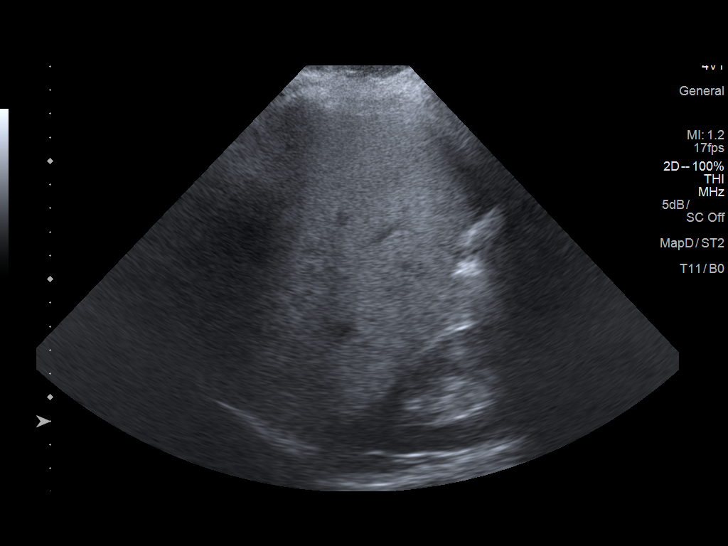
[im 17/40]
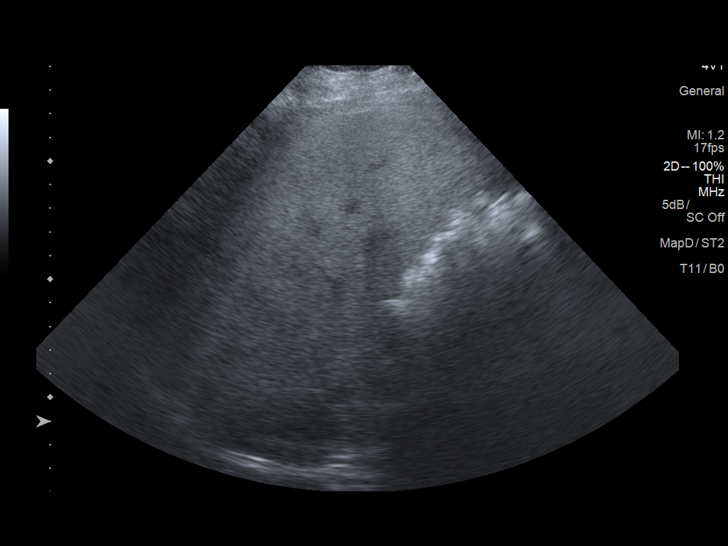
[im 20/40]
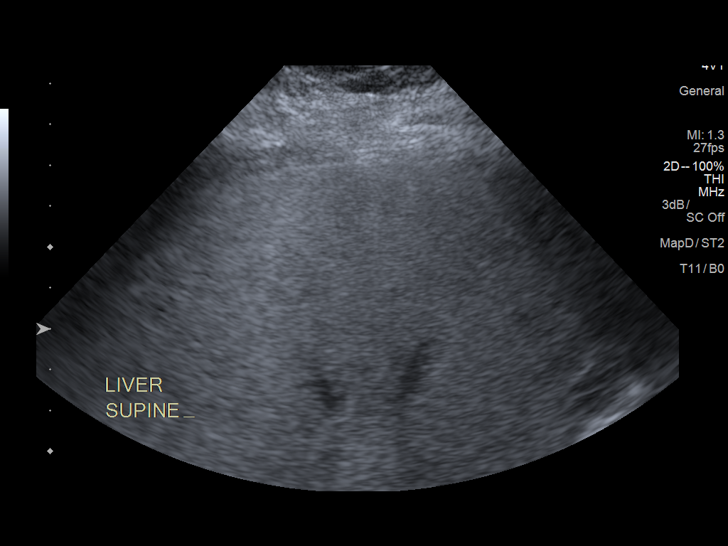
[im 23/40]
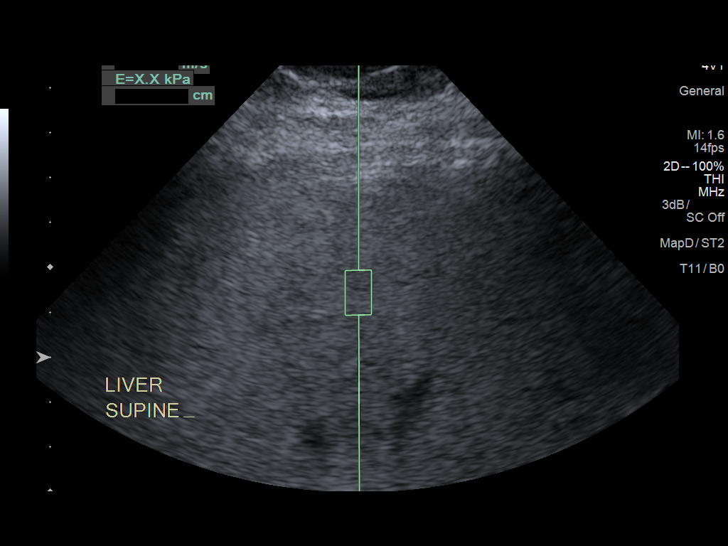
[im 27/40]
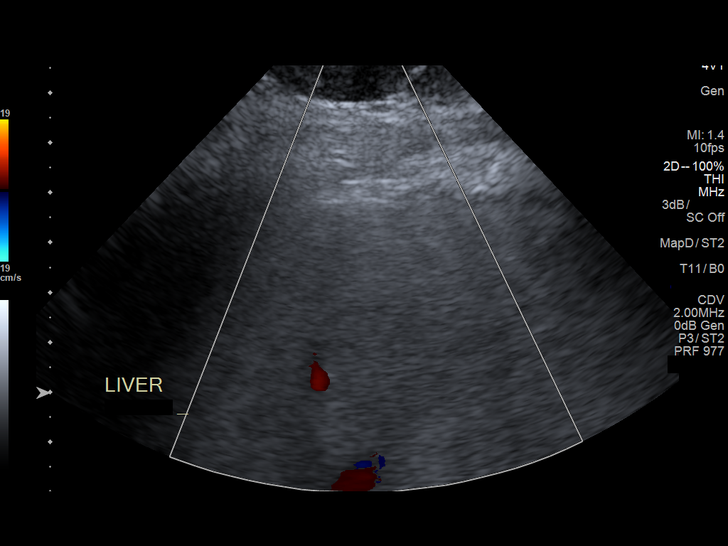
[im 30/40]
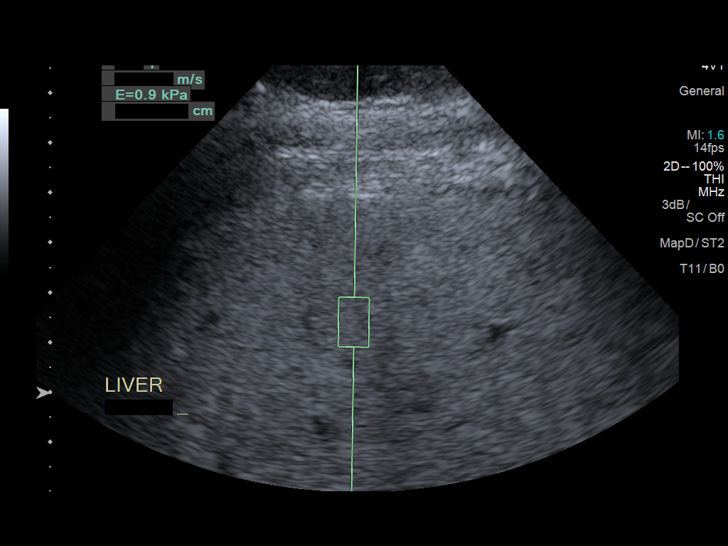
[im 33/40]
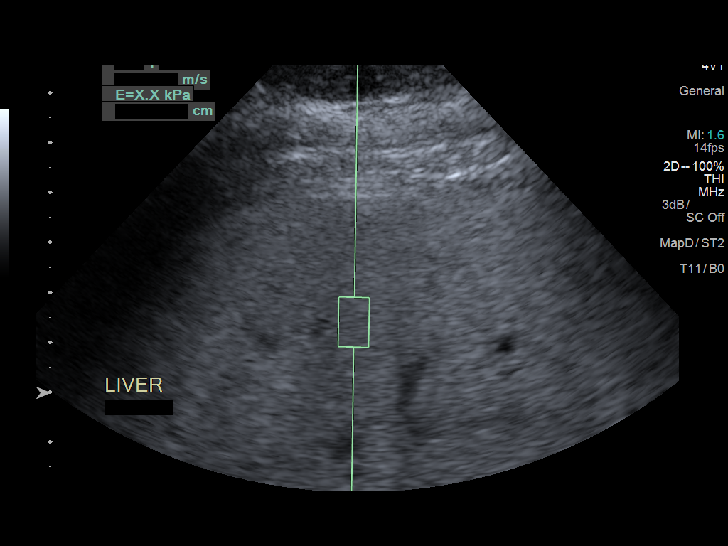
[im 36/40]
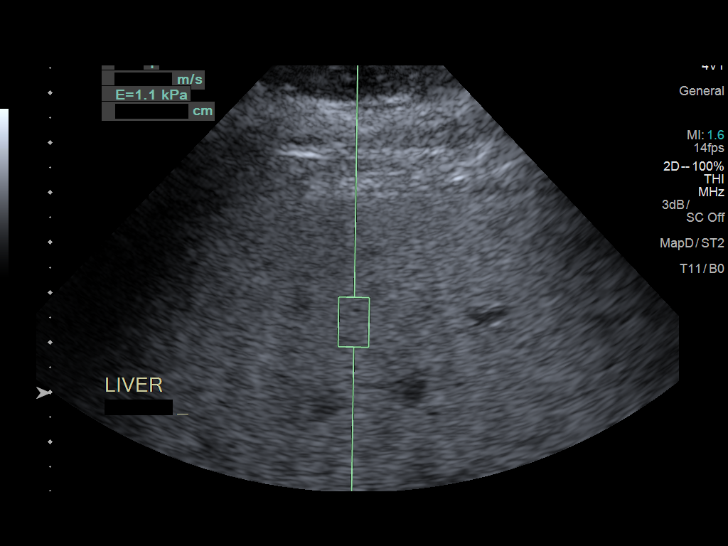
[im 40/40]
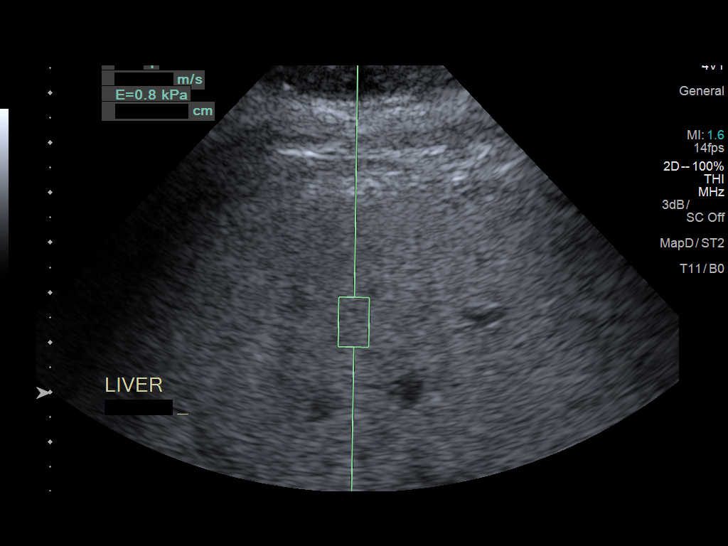

[13 of 25 positions shown; findings below may reference images not displayed]

FINDINGS: Liver: Mildly heterogeneous, increased echogenicity. The liver
contours appear slightly irregular. No focal lesions identified.
Portal vein is patent on color Doppler imaging with normal direction
of blood flow towards the liver.

ULTRASOUND HEPATIC ELASTOGRAPHY

Device: Siemens Helix VTQ

Transducer 4V1

Patient position: Left lateral decubitus

Number of measurements:  10

Hepatic Segment:  8

Median velocity:   0.56 m/sec

IQR:

IQR/Median velocity ratio

Corresponding Metavir fibrosis score:  F0/F1

Risk of fibrosis: Minimal

Limitations of exam: None

Pertinent findings noted on other imaging exams:  None

Please note that abnormal shear wave velocities may also be
identified in clinical settings other than with hepatic fibrosis,
such as: acute hepatitis, elevated right heart and central venous
pressures including use of beta blockers, Yusniel disease
([place]), infiltrative processes such as
mastocytosis/amyloidosis/infiltrative tumor, extrahepatic
cholestasis, in the post-prandial state, and liver transplantation.
Correlation with patient history, laboratory data, and clinical
condition recommended.
IMPRESSION: Liver: Echogenic liver without focal abnormality on limited imaging.

Elastography: Median hepatic shear wave velocity is calculated at
0.56 m/sec.

Corresponding Metavir fibrosis score is F0 / F1.

Risk of fibrosis is minimal.

Follow-up: None required.

## 2019-10-27 ENCOUNTER — Encounter: Payer: Self-pay | Admitting: Physician Assistant

## 2019-11-03 IMAGING — US US PELVIS COMPLETE
1 series · 13 of 25 positions shown · non-contrast
Comparison: pelvic ultrasound 02/17/2015 and 11/06/2012.

CLINICAL DATA: Polycystic ovarian disease and fibroids.



[Series 1: us pelvis complete · 0.31mm/px · 13 of 48 slices shown]
[im 1/48]
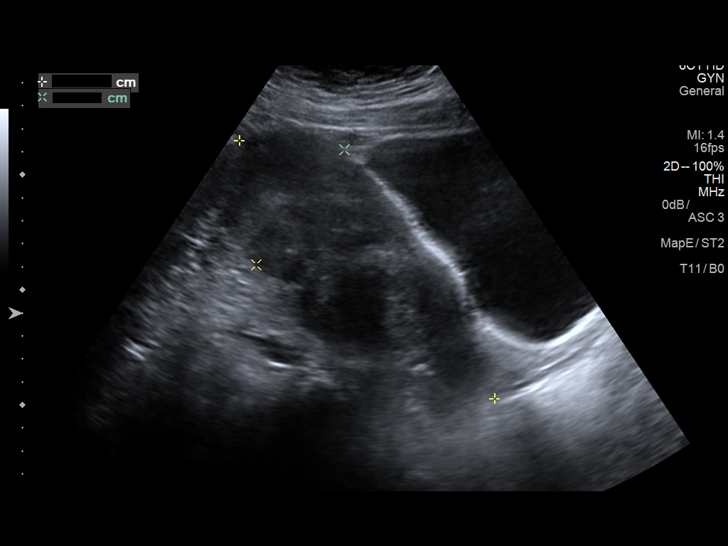
[im 4/48]
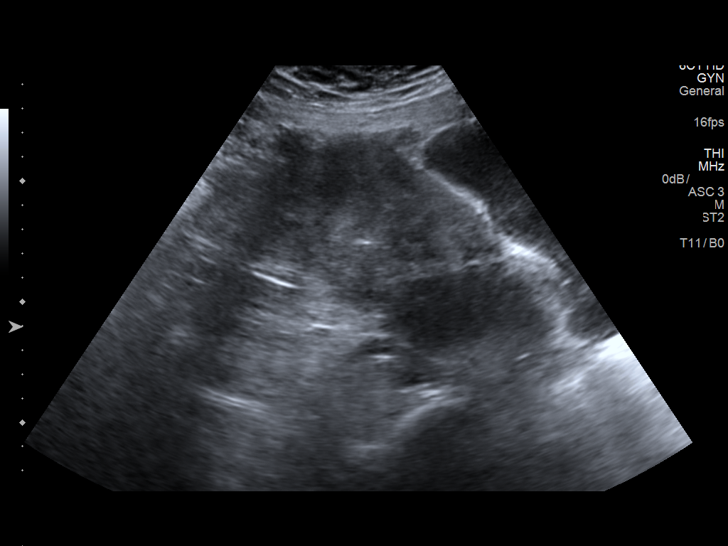
[im 8/48]
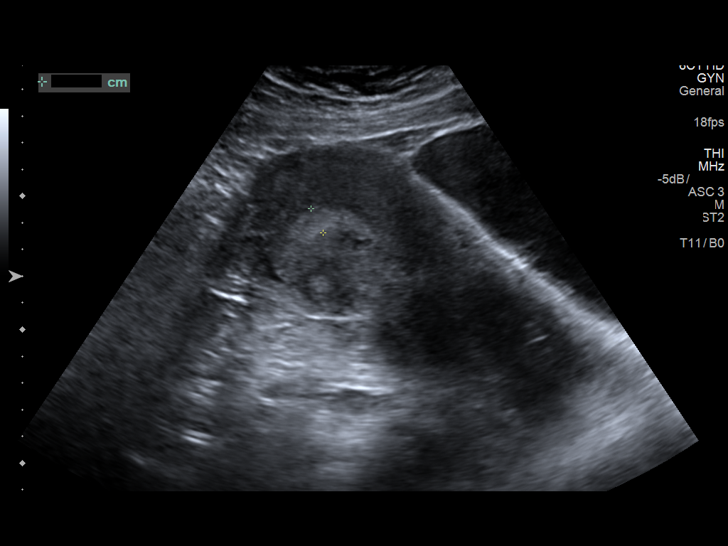
[im 12/48]
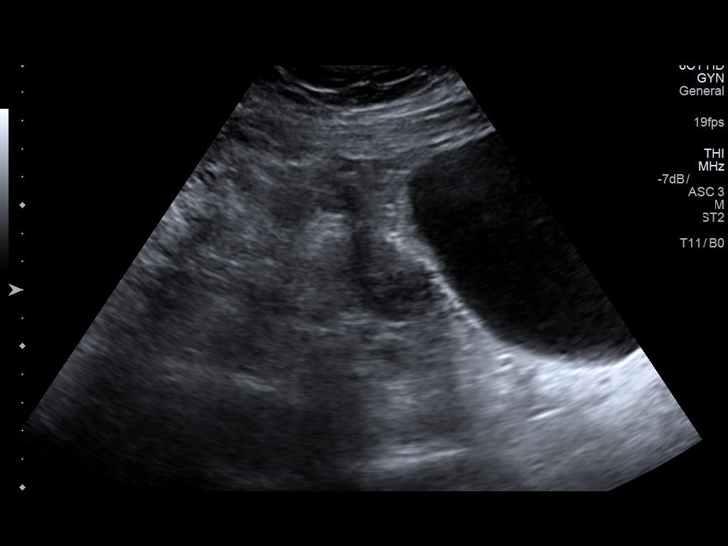
[im 16/48]
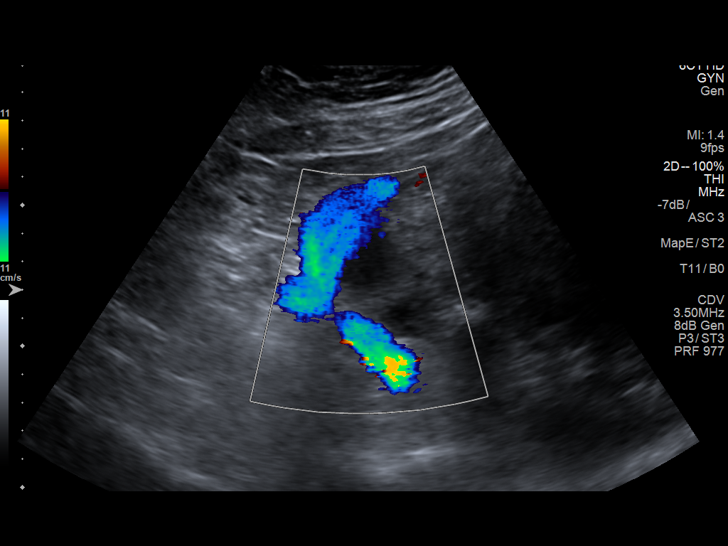
[im 20/48]
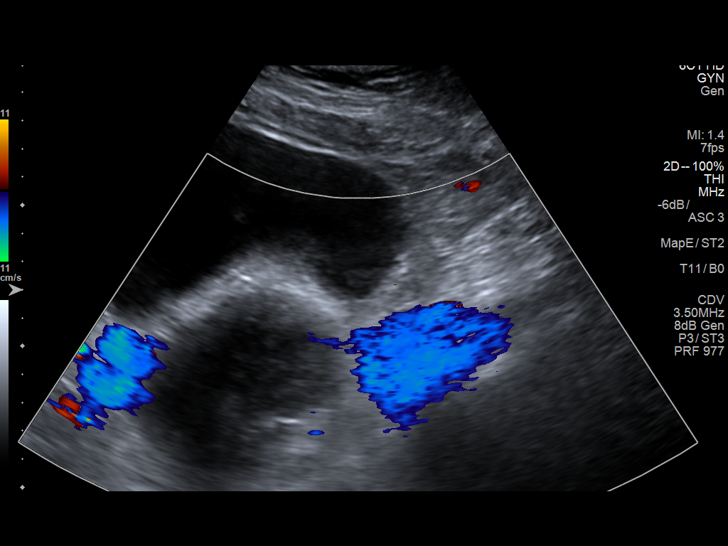
[im 24/48]
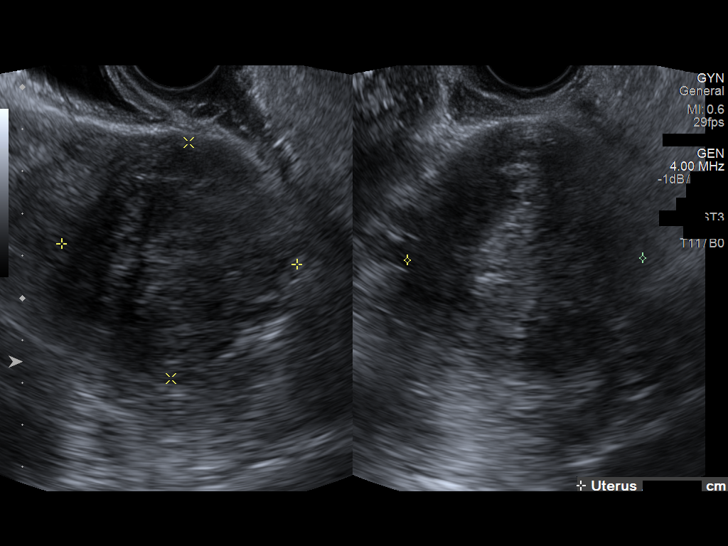
[im 28/48]
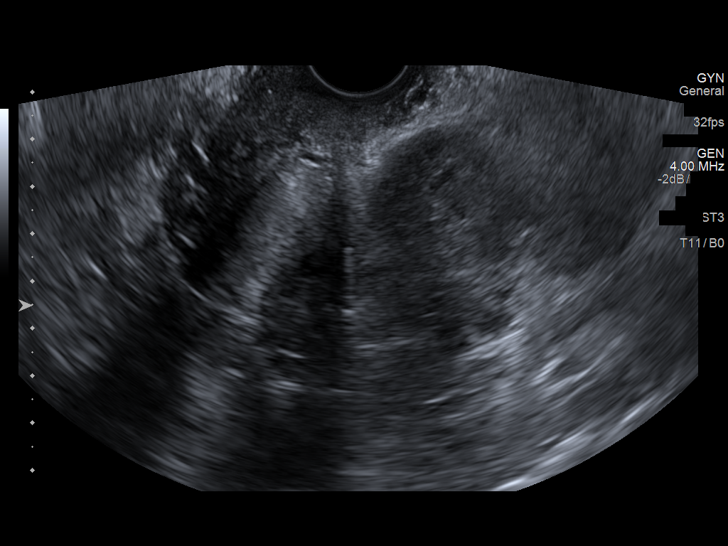
[im 32/48]
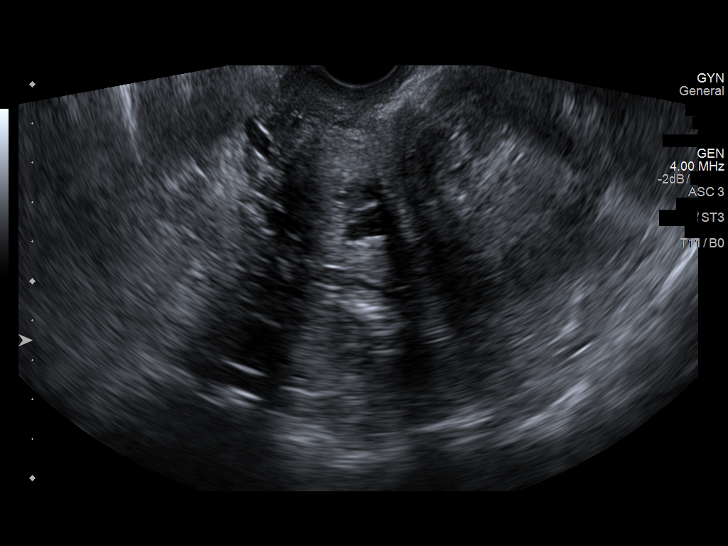
[im 36/48]
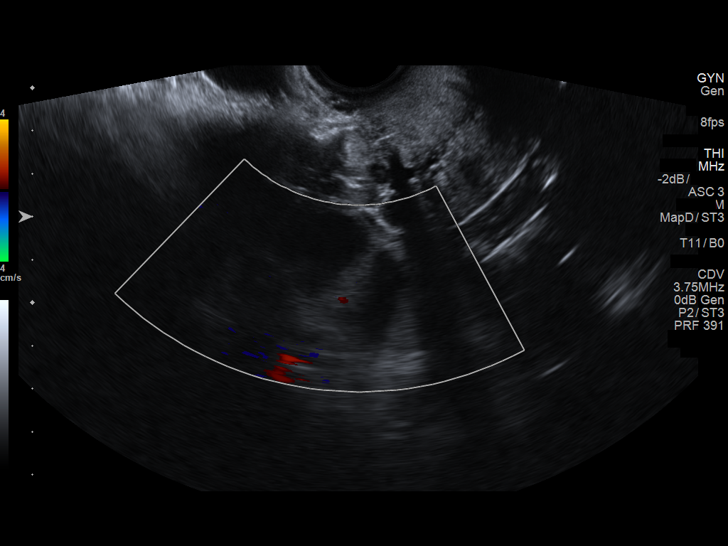
[im 40/48]
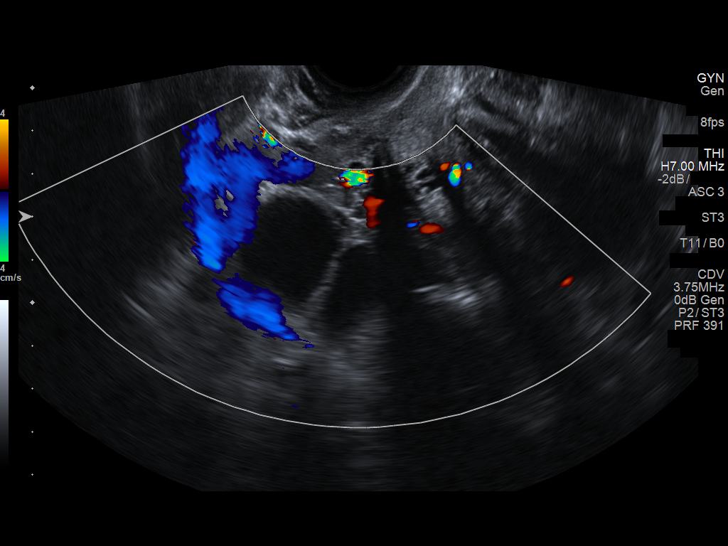
[im 44/48]
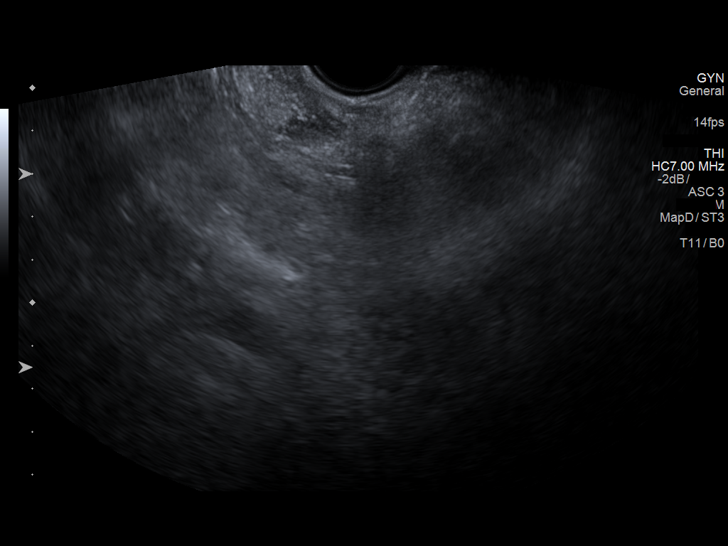
[im 48/48]
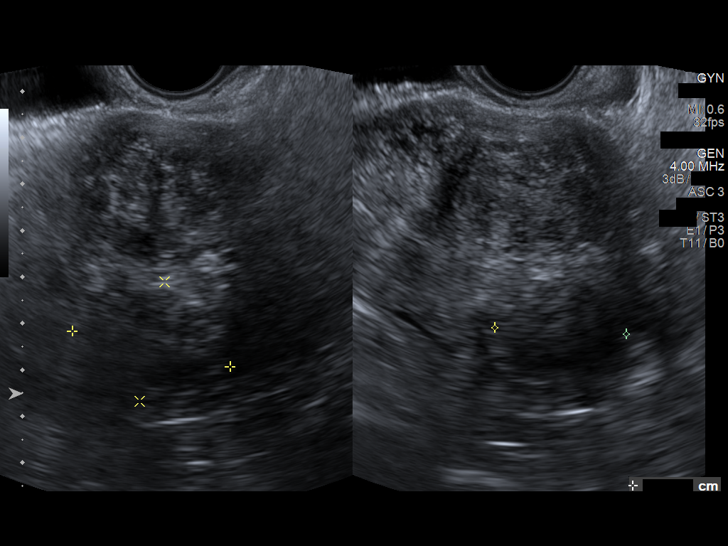

[13 of 25 positions shown; findings below may reference images not displayed]

FINDINGS: Uterus

Measurements: 15.8 x 6.3 x 10.8 cm.. Multiple bilateral
heterogeneous uterine fibroids are similar the prior exam. The
largest lesion is an exophytic fibroid extending from the right side
of the fundus measuring 9.7 x 9.0 x 9.2 cm. Least 3 other myometrial
fibroids are present on the left measuring 3.9, 3.5, and 4.1 cm
respectively. The previously noted IUD is not visualized on today's
exam

Endometrium

Thickness: 10 mm, within normal limits. No focal abnormality
visualized.

Right ovary

Measurements: 5.6 x 3.6 x 4.5 cm.  Multiple follicles are present.

Left ovary

Not visualized by transabdominal or transvaginal approach.

Other findings

No abnormal free fluid.
IMPRESSION: 1. Similar appearance least 4 heterogeneous uterine fibroids.
2. Cystic changes in the right ovary may be compatible with
polycystic ovarian syndrome.
3. Left ovary not visualized.

## 2019-11-12 ENCOUNTER — Other Ambulatory Visit: Payer: Self-pay

## 2019-11-12 ENCOUNTER — Encounter: Payer: Self-pay | Admitting: Physician Assistant

## 2019-11-12 ENCOUNTER — Ambulatory Visit: Payer: PRIVATE HEALTH INSURANCE | Admitting: Physician Assistant

## 2019-11-12 VITALS — BP 123/90 | HR 77 | Ht 69.0 in | Wt 257.6 lb

## 2019-11-12 DIAGNOSIS — N761 Subacute and chronic vaginitis: Secondary | ICD-10-CM

## 2019-11-12 MED ORDER — METRONIDAZOLE 500 MG PO TABS: 500.0000 mg | ORAL_TABLET | Freq: Two times a day (BID) | ORAL | 0 refills | Status: DC

## 2019-11-12 NOTE — Progress Notes (Signed)
   Subjective:    Patient ID: Melanie Perez, female    DOB: [birth year], 39 y.o.   MRN: [id number]  HPI  Pt is a 39 yo female who presents to the clinic to discuss vaginitis. She has had symptoms since about march or irritation, odor, discharge, itching. She has tried topical steroids and baking powder soaks. She has not been seen in clinic. No abdominal pain, fever, chills, body aches. She has IUD and no periods for the last year. She started using boric acid suppositories and symptoms almost cleared up.   .. Active Ambulatory Problems    Diagnosis Date Noted  . PCOS (polycystic ovarian syndrome) 11/07/2012  . Fibroids 02/11/2013  . Abnormal weight gain 03/20/2013  . Acne 03/20/2013  . Venous insufficiency of left leg 04/24/2013  . History of DVT of lower extremity 04/24/2013  . Varicose veins 04/24/2013  . Superficial thrombophlebitis 05/02/2013  . Superficial thrombophlebitis of left leg 05/02/2013  . Varicose veins of bilateral lower extremities with other complications 94/85/4627  . BMI 34.0-34.9,adult 07/26/2013  . DVT (deep venous thrombosis) ([place]) 10/24/2013  . Excessive hair growth 01/12/2014  . S/P laparoscopic cholecystectomy 02/19/2014  . Abnormal uterine bleeding (AUB) 02/24/2015  . Vitamin D deficiency 10/08/2015  . Anxiety and depression 11/26/2016  . Morbid obesity ([place]) 11/26/2016  . Medication side effect 04/10/2017  . Suspicious nevus 10/06/2017  . No energy 10/09/2017  . Non-restorative sleep 10/09/2017  . Snoring 10/09/2017  . Apnea 10/09/2017  . Left-sided chest pain 10/09/2017  . Dyslipidemia (high LDL; low HDL) 10/10/2017  . Elevated liver enzymes 10/10/2017  . Mild obstructive sleep apnea 11/25/2017   Resolved Ambulatory Problems    Diagnosis Date Noted  . BMI 37.0-37.9, adult 03/20/2013   Past Medical History:  Diagnosis Date  . Abnormal Pap smear   . History of DVT (deep vein thrombosis)   . History of PCOS   . History of uterine fibroid   .  HPV in female      Review of Systems  All other systems reviewed and are negative.      Objective:   Physical Exam Vitals reviewed.  Constitutional:      Appearance: Normal appearance. She is obese.  Cardiovascular:     Rate and Rhythm: Normal rate.  Pulmonary:     Effort: Pulmonary effort is normal.  Abdominal:     Palpations: Abdomen is soft.     Tenderness: There is no abdominal tenderness.  Neurological:     Mental Status: She is alert.  Psychiatric:        Mood and Affect: Mood normal.           Assessment & Plan:  Marland KitchenMarland KitchenMarye was seen today for vaginal itching.  Diagnoses and all orders for this visit:  Subacute vaginitis -     metroNIDAZOLE (FLAGYL) 500 MG tablet; Take 1 tablet (500 mg total) by mouth 2 (two) times daily. -     WET PREP FOR TRICH, YEAST, CLUE   Symptoms sound consistent with BV. Boric acid is helping where patient is not symptomatic today.  Discussed how to use this for prevention. Use least can keeping symptoms at [place]. Wet prep ordered. Gave metronidazole to only use for flare. Consider probiotic daily for vaginal health. HO given.

## 2019-11-12 NOTE — Patient Instructions (Signed)

## 2019-11-13 ENCOUNTER — Encounter: Payer: Self-pay | Admitting: Physician Assistant

## 2019-11-13 LAB — WET PREP FOR TRICH, YEAST, CLUE
MICRO NUMBER:: 10783243
Specimen Quality: ADEQUATE

## 2019-11-13 NOTE — Progress Notes (Signed)
No Trich/yeast/clue cells seen. Everything looks good but like we said the boric acid could be treating the BV. Continue with boric acid as needed for prevention.

## 2019-12-31 ENCOUNTER — Encounter: Payer: Self-pay | Admitting: Physician Assistant

## 2020-01-02 MED ORDER — ESTRADIOL 0.1 MG/GM VA CREA: 1.0000 | TOPICAL_CREAM | VAGINAL | 1 refills | Status: DC

## 2020-01-02 NOTE — Addendum Note (Signed)
Addended by: Donella Stade on: 01/02/2020 12:32 PM   Modules accepted: Orders

## 2020-02-07 ENCOUNTER — Other Ambulatory Visit (HOSPITAL_COMMUNITY)
Admission: RE | Admit: 2020-02-07 | Discharge: 2020-02-07 | Disposition: A | Discharge status: Home / Self Care | Payer: PRIVATE HEALTH INSURANCE | Source: Ambulatory Visit | Attending: Physician Assistant | Admitting: Physician Assistant

## 2020-02-07 ENCOUNTER — Ambulatory Visit: Payer: PRIVATE HEALTH INSURANCE | Admitting: Physician Assistant

## 2020-02-07 ENCOUNTER — Encounter: Payer: Self-pay | Admitting: Physician Assistant

## 2020-02-07 ENCOUNTER — Other Ambulatory Visit: Payer: Self-pay

## 2020-02-07 VITALS — BP 116/69 | HR 82 | Temp 98.8°F | Wt 258.1 lb

## 2020-02-07 DIAGNOSIS — Z124 Encounter for screening for malignant neoplasm of cervix: Secondary | ICD-10-CM

## 2020-02-07 DIAGNOSIS — L304 Erythema intertrigo: Secondary | ICD-10-CM | POA: Diagnosis not present

## 2020-02-07 DIAGNOSIS — G4730 Sleep apnea, unspecified: Secondary | ICD-10-CM

## 2020-02-07 DIAGNOSIS — Z1231 Encounter for screening mammogram for malignant neoplasm of breast: Secondary | ICD-10-CM

## 2020-02-07 DIAGNOSIS — Z6838 Body mass index (BMI) 38.0-38.9, adult: Secondary | ICD-10-CM

## 2020-02-07 DIAGNOSIS — N898 Other specified noninflammatory disorders of vagina: Secondary | ICD-10-CM

## 2020-02-07 DIAGNOSIS — E6609 Other obesity due to excess calories: Secondary | ICD-10-CM

## 2020-02-07 MED ORDER — NYSTATIN 100000 UNIT/GM EX OINT: 1.0000 "application " | TOPICAL_OINTMENT | Freq: Two times a day (BID) | CUTANEOUS | 1 refills | Status: DC

## 2020-02-07 MED ORDER — NYSTATIN 100000 UNIT/GM EX POWD: 1.0000 "application " | Freq: Three times a day (TID) | CUTANEOUS | 0 refills | Status: DC

## 2020-02-07 NOTE — Progress Notes (Signed)
Subjective:    Patient ID: Melanie Perez, female    DOB: 1980/10/22, 39 y.o.   MRN: [id number]  HPI  Patient is a 39 year old obese female who presents to the clinic for annual Pap and discuss ongoing vaginal discharge and irritation.  She has been diagnosed with bacterial vaginosis in the past.  She is going through metronidazole therapy as well as boric acid for prevention.  Boric acid was helping for a little while and it almost made her too dry.  She complains more of internal vaginal itching.  The symptoms did seem to happen after she got her IUD about a year ago for abnormal uterine bleeding.  We have tried vaginal estrogen 3 times a week.  She does say it feels better until about time for another estrogen treatment.  She is not having her periods with her IUD.  Patient is sexually active with her husband.  Patient does continue on Saxenda for weight loss.  She does feel like she has reached a plateau but feels like it overall helps her diet and portion control.  Home test 2019 sleep apnea but not on machine. Could help with weight loss.   .. Active Ambulatory Problems    Diagnosis Date Noted  . PCOS (polycystic ovarian syndrome) 11/07/2012  . Fibroids 02/11/2013  . Abnormal weight gain 03/20/2013  . Acne 03/20/2013  . Venous insufficiency of left leg 04/24/2013  . History of DVT of lower extremity 04/24/2013  . Varicose veins 04/24/2013  . Superficial thrombophlebitis 05/02/2013  . Superficial thrombophlebitis of left leg 05/02/2013  . Varicose veins of bilateral lower extremities with other complications 68/06/2120  . BMI 34.0-34.9,adult 07/26/2013  . DVT (deep venous thrombosis) ([place]) 10/24/2013  . Excessive hair growth 01/12/2014  . S/P laparoscopic cholecystectomy 02/19/2014  . Abnormal uterine bleeding (AUB) 02/24/2015  . Vitamin D deficiency 10/08/2015  . Anxiety and depression 11/26/2016  . Class 2 obesity due to excess calories without serious comorbidity with body mass  index (BMI) of 38.0 to 38.9 in adult 11/26/2016  . Medication side effect 04/10/2017  . Suspicious nevus 10/06/2017  . No energy 10/09/2017  . Non-restorative sleep 10/09/2017  . Snoring 10/09/2017  . Apnea 10/09/2017  . Left-sided chest pain 10/09/2017  . Dyslipidemia (high LDL; low HDL) 10/10/2017  . Elevated liver enzymes 10/10/2017  . Mild obstructive sleep apnea 11/25/2017  . Intertrigo 02/11/2020   Resolved Ambulatory Problems    Diagnosis Date Noted  . BMI 37.0-37.9, adult 03/20/2013   Past Medical History:  Diagnosis Date  . Abnormal Pap smear   . History of DVT (deep vein thrombosis)   . History of PCOS   . History of uterine fibroid   . HPV in female      Review of Systems  All other systems reviewed and are negative.      Objective:   Physical Exam Vitals reviewed.  Constitutional:      Appearance: Normal appearance. She is obese.  HENT:     Head: Normocephalic.  Cardiovascular:     Rate and Rhythm: Normal rate and regular rhythm.  Pulmonary:     Effort: Pulmonary effort is normal.     Breath sounds: Normal breath sounds.  Abdominal:     General: Bowel sounds are normal. There is no distension.     Palpations: Abdomen is soft. There is no mass.     Tenderness: There is no abdominal tenderness. There is no right CVA tenderness, left CVA tenderness or guarding.  Genitourinary:    General: Normal vulva.     Vagina: Vaginal discharge present.     Rectum: Normal.     Comments: Green/white milky discharge. IUD strings present. No polyps.  Skin:    Comments: Bilateral macular erythematous rash under both breast with satellite lesions and appears moist.   Neurological:     General: No focal deficit present.     Mental Status: She is alert and oriented to person, place, and time.  Psychiatric:        Mood and Affect: Mood normal.           Assessment & Plan:  Marland KitchenMarland KitchenCassidi was seen today for gynecologic exam.  Diagnoses and all orders for this  visit:  Cervical cancer screening -     Cytology - PAP  Vaginal itching -     SURESWAB CT/NG/T. vaginalis  Vaginal discharge -     SURESWAB CT/NG/T. vaginalis  Intertrigo -     nystatin ointment (MYCOSTATIN); Apply 1 application topically 2 (two) times daily. -     nystatin (MYCOSTATIN/NYSTOP) powder; Apply 1 application topically 3 (three) times daily.  Visit for screening mammogram -     MM 3D SCREEN BREAST BILATERAL  Class 2 obesity due to excess calories without serious comorbidity with body mass index (BMI) of 38.0 to 38.9 in adult   Ordered mammogram.  Pap done today. IUD in place. Sent off for sure swab vaginal full panel.  Discharge seems consistent with BV or normal discharge. No odor today.  Hold all vaginal treatments at this time. Estrogen/steriod/boric acid.   If sure swab does come back as normal patient may need to consider IUD removal and/or Saxenda cessation to see if there is any external cause of the symptoms.  Externally I do not see any cause for itching or irritation. Make sure taking vitamin D and calcium.   Rash under breast appears like yeast. Given ointment and powder. Discussed prevention.   Discussed weight loss. Continue on saxenda. I would like to consider CPAP for patient. She had home test 2019 showed mild apnea. She could benefit from CPAP. Will submit CPAP order for her to get set up.

## 2020-02-07 NOTE — Patient Instructions (Signed)
Intertrigo Intertrigo is skin irritation or inflammation (dermatitis) that occurs when folds of skin rub together. The irritation can cause a rash and make skin raw and itchy. This condition most commonly occurs in the skin folds of these areas:  Toes.  Armpits.  Groin.  Under the belly.  Under the breasts.  Buttocks. Intertrigo is not passed from person to person (is not contagious). What are the causes? This condition is caused by heat, moisture, rubbing (friction), and not enough air circulation. The condition can be made worse by:  Sweat.  Bacteria.  A fungus, such as yeast. What increases the risk? This condition is more likely to occur if you have moisture in your skin folds. You are more likely to develop this condition if you:  Have diabetes.  Are overweight.  Are not able to move around or are not active.  Live in a warm and moist climate.  Wear splints, braces, or other medical devices.  Are not able to control your bowels or bladder (have incontinence). What are the signs or symptoms? Symptoms of this condition include:  A pink or red skin rash in the skin fold or near the skin fold.  Raw or scaly skin.  Itchiness.  A burning feeling.  Bleeding.  Leaking fluid.  A bad smell. How is this diagnosed? This condition is diagnosed with a medical history and physical exam. You may also have a skin swab to test for bacteria or a fungus. How is this treated? This condition may be treated by:  Cleaning and drying your skin.  Taking an antibiotic medicine or using an antibiotic skin cream for a bacterial infection.  Using an antifungal cream on your skin or taking pills for an infection that was caused by a fungus, such as yeast.  Using a steroid ointment to relieve itchiness and irritation.  Separating the skin fold with a clean cotton cloth to absorb moisture and allow air to flow into the area. Follow these instructions at home:  Keep the  affected area clean and dry.  Do not scratch your skin.  Stay in a cool environment as much as possible. Use an air conditioner or fan, if available.  Apply over-the-counter and prescription medicines only as told by your health care provider.  If you were prescribed an antibiotic medicine, use it as told by your health care provider. Do not stop using the antibiotic even if your condition improves.  Keep all follow-up visits as told by your health care provider. This is important. How is this prevented?   Maintain a healthy weight.  Take care of your feet, especially if you have diabetes. Foot care includes: ? Wearing shoes that fit well. ? Keeping your feet dry. ? Wearing clean, breathable socks.  Protect the skin around your groin and buttocks, especially if you have incontinence. Skin protection includes: ? Following a regular cleaning routine. ? Using skin protectant creams, powders, or ointments. ? Changing protection pads frequently.  Do not wear tight clothes. Wear clothes that are loose, absorbent, and made of cotton.  Wear a bra that gives good support, if needed.  Shower and dry yourself well after activity or exercise. Use a hair dryer on a cool setting to dry between skin folds, especially after you bathe.  If you have diabetes, keep your blood sugar under control. Contact a health care provider if:  Your symptoms do not improve with treatment.  Your symptoms get worse or they spread.  You notice increased redness and  warmth.  You have a fever. Summary  Intertrigo is skin irritation or inflammation (dermatitis) that occurs when folds of skin rub together.  This condition is caused by heat, moisture, rubbing (friction), and not enough air circulation.  This condition may be treated by cleaning and drying your skin and with medicines.  Apply over-the-counter and prescription medicines only as told by your health care provider.  Keep all follow-up visits  as told by your health care provider. This is important. This information is not intended to replace advice given to you by your health care provider. Make sure you discuss any questions you have with your health care provider. Document Revised: 08/28/2017 Document Reviewed: 08/28/2017 Elsevier Patient Education  [place].

## 2020-02-10 LAB — SURESWAB CT/NG/T. VAGINALIS
N. gonorrhoeae RNA, TMA: NOT DETECTED
Trichomonas vaginalis RNA: NOT DETECTED

## 2020-02-10 NOTE — Progress Notes (Signed)
I wanted the full panel for BV and yeast. Can we add that?

## 2020-02-10 NOTE — Progress Notes (Signed)
Thanks. STD portion is negative.

## 2020-02-11 ENCOUNTER — Telehealth: Payer: Self-pay | Admitting: Physician Assistant

## 2020-02-11 DIAGNOSIS — L304 Erythema intertrigo: Secondary | ICD-10-CM | POA: Insufficient documentation

## 2020-02-11 DIAGNOSIS — G4730 Sleep apnea, unspecified: Secondary | ICD-10-CM

## 2020-02-11 LAB — CYTOLOGY - PAP
Comment: NEGATIVE
Diagnosis: NEGATIVE
High risk HPV: NEGATIVE

## 2020-02-11 NOTE — Telephone Encounter (Signed)
11/2017 sleep study mild sleep apnea. I cannot find actual report. I would like to get patient set up with CPAP based on those results.

## 2020-02-11 NOTE — Progress Notes (Signed)
Genise,   Negative HPV. No abnormal cells. Next pap 5 years. Still waiting for complete vaginosis panel.

## 2020-02-11 NOTE — Telephone Encounter (Signed)
Order entered and message sent to [facility].

## 2020-02-19 ENCOUNTER — Encounter: Payer: Self-pay | Admitting: Physician Assistant

## 2020-02-19 LAB — SURESWAB BACTERIAL VAGINOSIS/ITIS
Atopobium vaginae: NOT DETECTED Log (cells/mL)
C. albicans, DNA: NOT DETECTED
C. glabrata, DNA: NOT DETECTED
C. parapsilosis, DNA: NOT DETECTED
C. tropicalis, DNA: NOT DETECTED
Gardnerella vaginalis: 7.4 Log (cells/mL)
LACTOBACILLUS SPECIES: NOT DETECTED Log (cells/mL)
MEGASPHAERA SPECIES: NOT DETECTED Log (cells/mL)
Trichomonas vaginalis RNA: NOT DETECTED

## 2020-02-19 LAB — SURESWAB CT/NG/T. VAGINALIS: C. trachomatis RNA, TMA: NOT DETECTED

## 2020-02-20 ENCOUNTER — Encounter: Payer: Self-pay | Admitting: Physician Assistant

## 2020-02-20 DIAGNOSIS — N761 Subacute and chronic vaginitis: Secondary | ICD-10-CM

## 2020-02-20 NOTE — Progress Notes (Signed)
Sure swab finally came back. You continue to have BV. Have we ever used the metronidazole gel vaginally? We could do this regularly for months to see if gets rid of BV? I really think you could continue saxenda but if you want to stay off of it for a bit we could always add it back in. Did you ever get the pH vaginal strips to test vaginal pH?

## 2020-02-21 MED ORDER — METRONIDAZOLE 1.3 % VA GEL: VAGINAL | 5 refills | Status: DC

## 2020-03-16 MED ORDER — HYDROCORTISONE VALERATE 0.2 % EX CREA: 1.0000 "application " | TOPICAL_CREAM | Freq: Two times a day (BID) | CUTANEOUS | 0 refills | Status: DC

## 2020-03-16 MED ORDER — METRONIDAZOLE 500 MG PO TABS: 500.0000 mg | ORAL_TABLET | Freq: Two times a day (BID) | ORAL | 0 refills | Status: DC

## 2020-03-16 NOTE — Addendum Note (Signed)
Addended by: Donella Stade on: 03/16/2020 04:39 PM   Modules accepted: Orders

## 2020-03-16 NOTE — Addendum Note (Signed)
Addended by: Donella Stade on: 03/16/2020 09:23 AM   Modules accepted: Orders

## 2020-03-26 LAB — HM MAMMOGRAPHY

## 2020-05-05 ENCOUNTER — Other Ambulatory Visit: Payer: Self-pay | Admitting: Physician Assistant

## 2020-05-29 ENCOUNTER — Encounter: Payer: Self-pay | Admitting: Physician Assistant

## 2020-06-06 ENCOUNTER — Other Ambulatory Visit: Payer: Self-pay | Admitting: Physician Assistant

## 2020-06-09 ENCOUNTER — Encounter: Payer: Self-pay | Admitting: Physician Assistant

## 2020-06-09 ENCOUNTER — Telehealth (INDEPENDENT_AMBULATORY_CARE_PROVIDER_SITE_OTHER): Payer: PRIVATE HEALTH INSURANCE | Admitting: Physician Assistant

## 2020-06-09 VITALS — Ht 69.0 in | Wt 262.0 lb

## 2020-06-09 DIAGNOSIS — F419 Anxiety disorder, unspecified: Secondary | ICD-10-CM

## 2020-06-09 DIAGNOSIS — Z6838 Body mass index (BMI) 38.0-38.9, adult: Secondary | ICD-10-CM

## 2020-06-09 DIAGNOSIS — F32A Depression, unspecified: Secondary | ICD-10-CM | POA: Diagnosis not present

## 2020-06-09 DIAGNOSIS — E6609 Other obesity due to excess calories: Secondary | ICD-10-CM

## 2020-06-09 MED ORDER — WEGOVY 1.7 MG/0.75ML ~~LOC~~ SOAJ: 1.7000 mg | SUBCUTANEOUS | 0 refills | Status: DC

## 2020-06-09 MED ORDER — WEGOVY 1 MG/0.5ML ~~LOC~~ SOAJ: 1.0000 mg | SUBCUTANEOUS | 0 refills | Status: DC

## 2020-06-09 MED ORDER — WEGOVY 2.4 MG/0.75ML ~~LOC~~ SOAJ: 2.4000 mg | SUBCUTANEOUS | 1 refills | Status: DC

## 2020-06-09 NOTE — Progress Notes (Signed)
Has been taking Saxenda for over a year, wants to discuss switching to [facility].

## 2020-06-09 NOTE — Progress Notes (Signed)
Patient ID: Melanie Perez, female   DOB: 01-28-1981, 40 y.o.   MRN: [id number] .Marland KitchenVirtual Visit via Video Note  I connected with Melanie Perez on 06/09/2020 at  1:00 PM EST by a video enabled telemedicine application and verified that I am speaking with the correct person using two identifiers.  Location: Patient: home Provider: clinic  .Marland KitchenParticipating in visit:  Patient: Melanie Perez Provider: Iran Planas PA-C   I discussed the limitations of evaluation and management by telemedicine and the availability of in person appointments. The patient expressed understanding and agreed to proceed.  History of Present Illness: Pt is a 40 yo obese female with OSA, PCOS who presents to the clinic to discuss weight. She has been on saxenda for 1 year and lost 50lbs of weight but has reached a plateau. She is interested in trying wegovy. She has tolerated saxenda well. Pt is staying active. She is watching her portions. She is maintaining but not losing.   .. Active Ambulatory Problems    Diagnosis Date Noted  . PCOS (polycystic ovarian syndrome) 11/07/2012  . Fibroids 02/11/2013  . Abnormal weight gain 03/20/2013  . Acne 03/20/2013  . Venous insufficiency of left leg 04/24/2013  . History of DVT of lower extremity 04/24/2013  . Varicose veins 04/24/2013  . Superficial thrombophlebitis 05/02/2013  . Superficial thrombophlebitis of left leg 05/02/2013  . Varicose veins of bilateral lower extremities with other complications 40/98/1191  . BMI 34.0-34.9,adult 07/26/2013  . DVT (deep venous thrombosis) ([place]) 10/24/2013  . Excessive hair growth 01/12/2014  . S/P laparoscopic cholecystectomy 02/19/2014  . Abnormal uterine bleeding (AUB) 02/24/2015  . Vitamin D deficiency 10/08/2015  . Anxiety and depression 11/26/2016  . Class 2 obesity due to excess calories without serious comorbidity with body mass index (BMI) of 38.0 to 38.9 in adult 11/26/2016  . Medication side effect 04/10/2017  . Suspicious nevus  10/06/2017  . No energy 10/09/2017  . Non-restorative sleep 10/09/2017  . Snoring 10/09/2017  . Apnea 10/09/2017  . Left-sided chest pain 10/09/2017  . Dyslipidemia (high LDL; low HDL) 10/10/2017  . Elevated liver enzymes 10/10/2017  . Mild obstructive sleep apnea 11/25/2017  . Intertrigo 02/11/2020   Resolved Ambulatory Problems    Diagnosis Date Noted  . BMI 37.0-37.9, adult 03/20/2013   Past Medical History:  Diagnosis Date  . Abnormal Pap smear   . History of DVT (deep vein thrombosis)   . History of PCOS   . History of uterine fibroid   . HPV in female    Reviewed med, allergy, problem list.     Observations/Objective: No acute distress Normal mood and appearance  .[name] Today's Vitals   06/09/20 1103  Weight: 262 lb (118.8 kg)  Height: 5\' 9"  (1.753 m)   Body mass index is 38.69 kg/m.    Assessment and Plan: Marland KitchenMarland KitchenDiagnoses and all orders for this visit:  Class 2 obesity due to excess calories without serious comorbidity with body mass index (BMI) of 38.0 to 38.9 in adult -     Semaglutide-Weight Management (WEGOVY) 1 MG/0.5ML SOAJ; Inject 1 mg into the skin once a week. -     Semaglutide-Weight Management (WEGOVY) 1.7 MG/0.75ML SOAJ; Inject 1.7 mg into the skin once a week. -     Semaglutide-Weight Management (WEGOVY) 2.4 MG/0.75ML SOAJ; Inject 2.4 mg into the skin once a week.  [name].Discussed low carb diet with 1500 calories and 80g of protein.  Exercising at least 150 minutes a week.  My Fitness Pal could be a  great resource.  Stop saxenda.  Start wegovy. Discussed how to titrate up and side effects.  Follow up in 3 months.     Follow Up Instructions:    I discussed the assessment and treatment plan with the patient. The patient was provided an opportunity to ask questions and all were answered. The patient agreed with the plan and demonstrated an understanding of the instructions.   The patient was advised to call back or seek an in-person evaluation if  the symptoms worsen or if the condition fails to improve as anticipated.   Iran Planas, PA-C

## 2020-06-10 MED ORDER — VENLAFAXINE HCL ER 37.5 MG PO CP24: ORAL_CAPSULE | ORAL | 4 refills | Status: DC

## 2020-06-10 MED ORDER — ATORVASTATIN CALCIUM 40 MG PO TABS: 40.0000 mg | ORAL_TABLET | Freq: Every day | ORAL | 3 refills | Status: DC

## 2020-06-18 ENCOUNTER — Encounter: Payer: Self-pay | Admitting: Physician Assistant

## 2020-08-05 ENCOUNTER — Encounter: Payer: Self-pay | Admitting: Physician Assistant

## 2020-08-06 NOTE — Telephone Encounter (Signed)
Patient has been scheduled

## 2020-09-30 ENCOUNTER — Other Ambulatory Visit: Payer: Self-pay | Admitting: Physician Assistant

## 2020-09-30 DIAGNOSIS — E6609 Other obesity due to excess calories: Secondary | ICD-10-CM

## 2020-09-30 DIAGNOSIS — Z6838 Body mass index (BMI) 38.0-38.9, adult: Secondary | ICD-10-CM

## 2020-10-23 ENCOUNTER — Ambulatory Visit: Payer: PRIVATE HEALTH INSURANCE | Admitting: Physician Assistant

## 2020-10-23 ENCOUNTER — Other Ambulatory Visit: Payer: Self-pay

## 2020-10-23 VITALS — BP 121/74 | HR 86 | Ht 69.0 in | Wt 256.0 lb

## 2020-10-23 DIAGNOSIS — K649 Unspecified hemorrhoids: Secondary | ICD-10-CM

## 2020-10-23 DIAGNOSIS — Z13220 Encounter for screening for lipoid disorders: Secondary | ICD-10-CM | POA: Diagnosis not present

## 2020-10-23 DIAGNOSIS — Z1329 Encounter for screening for other suspected endocrine disorder: Secondary | ICD-10-CM

## 2020-10-23 DIAGNOSIS — Z131 Encounter for screening for diabetes mellitus: Secondary | ICD-10-CM | POA: Diagnosis not present

## 2020-10-23 DIAGNOSIS — Z0000 Encounter for general adult medical examination without abnormal findings: Secondary | ICD-10-CM

## 2020-10-23 DIAGNOSIS — E6609 Other obesity due to excess calories: Secondary | ICD-10-CM

## 2020-10-23 DIAGNOSIS — Z6837 Body mass index (BMI) 37.0-37.9, adult: Secondary | ICD-10-CM

## 2020-10-23 MED ORDER — WEGOVY 1.7 MG/0.75ML ~~LOC~~ SOAJ: 1.7000 mg | SUBCUTANEOUS | 5 refills | Status: DC

## 2020-10-23 NOTE — Progress Notes (Signed)
Subjective:    Patient ID: Melanie Perez, female    DOB: July 20, 1980, 40 y.o.   MRN: [id number]  HPI Pt is a 40 yo obese woman who presents to the clinic to follow up on weight loss and wegovy.   Pt did titrate up to 2.$Remov'4mg'dycdMf$  of wegovy but made her appetite completely go away and she did not like that. She felt like she was making herself eat. Was was 70 when started in march. 256 today. She has not lost a lot of weight but has remained stable. She is not exercising. She would like to continue at lower dose to see if she has some trigger of appetite. No side effects. She would like to consider bariatric referral if no significant gains in weight loss at next follow up in 6 months.   Pt has not had fasting labs drawn in a while.   Pt reports having more and more problems with hemorrhoids. They itch and bulge at times. Overall she has more diarrhea than constipation.    .. Active Ambulatory Problems    Diagnosis Date Noted   PCOS (polycystic ovarian syndrome) 11/07/2012   Fibroids 02/11/2013   Abnormal weight gain 03/20/2013   Acne 03/20/2013   Venous insufficiency of left leg 04/24/2013   History of DVT of lower extremity 04/24/2013   Varicose veins 04/24/2013   Superficial thrombophlebitis 05/02/2013   Superficial thrombophlebitis of left leg 05/02/2013   Varicose veins of bilateral lower extremities with other complications 88/82/8003   BMI 34.0-34.9,adult 07/26/2013   DVT (deep venous thrombosis) ([facility]) 10/24/2013   Excessive hair growth 01/12/2014   S/P laparoscopic cholecystectomy 02/19/2014   Abnormal uterine bleeding (AUB) 02/24/2015   Vitamin D deficiency 10/08/2015   Anxiety and depression 11/26/2016   Class 2 obesity due to excess calories without serious comorbidity with body mass index (BMI) of 37.0 to 37.9 in adult 11/26/2016   Medication side effect 04/10/2017   Suspicious nevus 10/06/2017   No energy 10/09/2017   Non-restorative sleep 10/09/2017   Snoring 10/09/2017    Apnea 10/09/2017   Left-sided chest pain 10/09/2017   Dyslipidemia (high LDL; low HDL) 10/10/2017   Elevated liver enzymes 10/10/2017   Mild obstructive sleep apnea 11/25/2017   Intertrigo 02/11/2020   Hemorrhoids 10/24/2020   Resolved Ambulatory Problems    Diagnosis Date Noted   BMI 37.0-37.9, adult 03/20/2013   Past Medical History:  Diagnosis Date   Abnormal Pap smear    History of DVT (deep vein thrombosis)    History of PCOS    History of uterine fibroid    HPV in female     Review of Systems See HPI.     Objective:   Physical Exam Vitals reviewed.  Constitutional:      Appearance: Normal appearance. She is obese.  HENT:     Head: Normocephalic.  Cardiovascular:     Rate and Rhythm: Normal rate and regular rhythm.  Pulmonary:     Effort: Pulmonary effort is normal.     Breath sounds: Normal breath sounds.  Neurological:     General: No focal deficit present.     Mental Status: She is alert.  Psychiatric:        Mood and Affect: Mood normal.          Assessment & Plan:  Marland KitchenMarland KitchenAkeila was seen today for weight check.  Diagnoses and all orders for this visit:  Class 2 obesity due to excess calories without serious comorbidity with body mass index (  BMI) of 37.0 to 37.9 in adult -     Semaglutide-Weight Management (WEGOVY) 1.7 MG/0.75ML SOAJ; Inject 1.7 mg into the skin once a week. -     TSH -     Lipid Profile -     CMP14+EGFR -     CBC with Differential/Platelet  Screening for diabetes mellitus -     COMPLETE METABOLIC PANEL WITH GFR -     CMP14+EGFR  Screening for lipid disorders -     Lipid Panel w/reflex Direct LDL -     Lipid Profile  Thyroid disorder screening -     Cancel: TSH -     TSH  Hemorrhoids, unspecified hemorrhoid type -     Ambulatory referral to Gastroenterology  Other orders -     Cancel: CBC with Differential/Platelet   Continue wegovy at 1.33m weekly.  Continue with diet and exercise.  Consider bariatric referral if  not getting to goal.  Fasting labs ordered.  Referral made to GI for hemorrhoids.  Discussed symptomatic care for now.  Follow up in 6 months.

## 2020-10-24 ENCOUNTER — Encounter: Payer: Self-pay | Admitting: Physician Assistant

## 2020-10-24 DIAGNOSIS — K649 Unspecified hemorrhoids: Secondary | ICD-10-CM | POA: Insufficient documentation

## 2020-10-24 NOTE — Patient Instructions (Signed)
Hemorrhoids Hemorrhoids are swollen veins that may develop: In the butt (rectum). These are called internal hemorrhoids. Around the opening of the butt (anus). These are called external hemorrhoids. Hemorrhoids can cause pain, itching, or bleeding. Most of the time, they do not cause serious problems. They usually get better with diet changes, lifestylechanges, and other home treatments. What are the causes? This condition may be caused by: Having trouble pooping (constipation). Pushing hard (straining) to poop. Watery poop (diarrhea). Pregnancy. Being very overweight (obese). Sitting for long periods of time. Heavy lifting or other activity that causes you to strain. Anal sex. Riding a bike for a long period of time. What are the signs or symptoms? Symptoms of this condition include: Pain. Itching or soreness in the butt. Bleeding from the butt. Leaking poop. Swelling in the area. One or more lumps around the opening of your butt. How is this diagnosed? A doctor can often diagnose this condition by looking at the affected area. The doctor may also: Do an exam that involves feeling the area with a gloved hand (digital rectal exam). Examine the area inside your butt using a small tube (anoscope). Order blood tests. This may be done if you have lost a lot of blood. Have you get a test that involves looking inside the colon using a flexible tube with a camera on the end (sigmoidoscopy or colonoscopy). How is this treated? This condition can usually be treated at home. Your doctor may tell you to change what you eat, make lifestyle changes, or try home treatments. If these do not help, procedures can be done to remove the hemorrhoids or make them smaller. These may involve: Placing rubber bands at the base of the hemorrhoids to cut off their blood supply. Injecting medicine into the hemorrhoids to shrink them. Shining a type of light energy onto the hemorrhoids to cause them to fall  off. Doing surgery to remove the hemorrhoids or cut off their blood supply. Follow these instructions at home: Eating and drinking  Eat foods that have a lot of fiber in them. These include whole grains, beans, nuts, fruits, and vegetables. Ask your doctor about taking products that have added fiber (fibersupplements). Reduce the amount of fat in your diet. You can do this by: Eating low-fat dairy products. Eating less red meat. Avoiding processed foods. Drink enough fluid to keep your pee (urine) pale yellow.  Managing pain and swelling  Take a warm-water bath (sitz bath) for 20 minutes to ease pain. Do this 3-4 times a day. You may do this in a bathtub or using a portable sitz bath that fits over the toilet. If told, put ice on the painful area. It may be helpful to use ice between your warm baths. Put ice in a plastic bag. Place a towel between your skin and the bag. Leave the ice on for 20 minutes, 2-3 times a day.  General instructions Take over-the-counter and prescription medicines only as told by your doctor. Medicated creams and medicines may be used as told. Exercise often. Ask your doctor how much and what kind of exercise is best for you. Go to the bathroom when you have the urge to poop. Do not wait. Avoid pushing too hard when you poop. Keep your butt dry and clean. Use wet toilet paper or moist towelettes after pooping. Do not sit on the toilet for a long time. Keep all follow-up visits as told by your doctor. This is important. Contact a doctor if you: Have pain  and swelling that do not get better with treatment or medicine. Have trouble pooping. Cannot poop. Have pain or swelling outside the area of the hemorrhoids. Get help right away if you have: Bleeding that will not stop. Summary Hemorrhoids are swollen veins in the butt or around the opening of the butt. They can cause pain, itching, or bleeding. Eat foods that have a lot of fiber in them. These include  whole grains, beans, nuts, fruits, and vegetables. Take a warm-water bath (sitz bath) for 20 minutes to ease pain. Do this 3-4 times a day. This information is not intended to replace advice given to you by your health care provider. Make sure you discuss any questions you have with your healthcare provider. Document Revised: 04/05/2018 Document Reviewed: 08/17/2017 Elsevier Patient Education  [place].

## 2020-12-17 ENCOUNTER — Encounter: Payer: Self-pay | Admitting: Physician Assistant

## 2021-04-18 ENCOUNTER — Encounter: Payer: Self-pay | Admitting: Physician Assistant

## 2021-04-18 DIAGNOSIS — Z131 Encounter for screening for diabetes mellitus: Secondary | ICD-10-CM

## 2021-04-18 DIAGNOSIS — Z13220 Encounter for screening for lipoid disorders: Secondary | ICD-10-CM

## 2021-04-18 DIAGNOSIS — R5383 Other fatigue: Secondary | ICD-10-CM

## 2021-04-18 DIAGNOSIS — Z0000 Encounter for general adult medical examination without abnormal findings: Secondary | ICD-10-CM

## 2021-04-18 DIAGNOSIS — E6609 Other obesity due to excess calories: Secondary | ICD-10-CM

## 2021-04-18 DIAGNOSIS — Z1329 Encounter for screening for other suspected endocrine disorder: Secondary | ICD-10-CM

## 2021-04-21 ENCOUNTER — Telehealth: Payer: Self-pay

## 2021-04-21 NOTE — Telephone Encounter (Signed)
Medication: Semaglutide-Weight Management (WEGOVY) 1.7 MG/0.75ML SOAJ Prior authorization submitted via CoverMyMeds on 04/21/2021 [place] submission pending

## 2021-04-27 NOTE — Progress Notes (Signed)
Barry,   Cholesterol much better.  Estrogen is low but still in the normal range.  FSH is not in menopausal range.  Kidney, liver, glucose look good.  Thyroid looks good.  B12 looks good.  Vitamin D better than 3 years ago but low normal. Take at least 2000 units a day.  Iron looks great.

## 2021-04-28 ENCOUNTER — Ambulatory Visit: Payer: PRIVATE HEALTH INSURANCE | Admitting: Physician Assistant

## 2021-04-28 ENCOUNTER — Other Ambulatory Visit: Payer: Self-pay

## 2021-04-28 ENCOUNTER — Encounter: Payer: Self-pay | Admitting: Physician Assistant

## 2021-04-28 VITALS — BP 125/41 | HR 91 | Ht 69.0 in | Wt 239.1 lb

## 2021-04-28 DIAGNOSIS — G9332 Myalgic encephalomyelitis/chronic fatigue syndrome: Secondary | ICD-10-CM | POA: Diagnosis not present

## 2021-04-28 DIAGNOSIS — Z1231 Encounter for screening mammogram for malignant neoplasm of breast: Secondary | ICD-10-CM | POA: Diagnosis not present

## 2021-04-28 DIAGNOSIS — E785 Hyperlipidemia, unspecified: Secondary | ICD-10-CM

## 2021-04-28 DIAGNOSIS — Z6835 Body mass index (BMI) 35.0-35.9, adult: Secondary | ICD-10-CM

## 2021-04-28 DIAGNOSIS — F32A Depression, unspecified: Secondary | ICD-10-CM

## 2021-04-28 DIAGNOSIS — U099 Post covid-19 condition, unspecified: Secondary | ICD-10-CM

## 2021-04-28 DIAGNOSIS — Z0000 Encounter for general adult medical examination without abnormal findings: Secondary | ICD-10-CM | POA: Diagnosis not present

## 2021-04-28 DIAGNOSIS — F419 Anxiety disorder, unspecified: Secondary | ICD-10-CM | POA: Diagnosis not present

## 2021-04-28 DIAGNOSIS — E6609 Other obesity due to excess calories: Secondary | ICD-10-CM

## 2021-04-28 LAB — CP TESTOSTERONE, BIO-FEMALE/CHILDREN
Albumin: 3.9 g/dL (ref 3.6–5.1)
Sex Hormone Binding: 38.1 nmol/L (ref 17–124)
TESTOSTERONE, BIOAVAILABLE: 2.5 ng/dL (ref 0.5–8.5)
Testosterone, Free: 1.4 pg/mL (ref 0.2–5.0)
Testosterone, Total, LC-MS-MS: 13 ng/dL (ref 2–45)

## 2021-04-28 LAB — LIPID PANEL W/REFLEX DIRECT LDL
Cholesterol: 137 mg/dL (ref <200)
HDL: 39 mg/dL — ABNORMAL LOW (ref >=50)
LDL Cholesterol (Calc): 84 mg/dL (calc)
Non-HDL Cholesterol (Calc): 98 mg/dL (calc) (ref <130)
Total CHOL/HDL Ratio: 3.5 (calc) (ref <5.0)
Triglycerides: 60 mg/dL (ref <150)

## 2021-04-28 LAB — CBC WITH DIFFERENTIAL/PLATELET
Absolute Monocytes: 462 cells/uL (ref 200–950)
Basophils Absolute: 39 cells/uL (ref 0–200)
Basophils Relative: 0.6 %
Eosinophils Absolute: 0 cells/uL — ABNORMAL LOW (ref 15–500)
Eosinophils Relative: 0 %
HCT: 42.3 % (ref 35.0–45.0)
Hemoglobin: 14.6 g/dL (ref 11.7–15.5)
Lymphs Abs: 1658 cells/uL (ref 850–3900)
MCH: 31.9 pg (ref 27.0–33.0)
MCHC: 34.5 g/dL (ref 32.0–36.0)
MCV: 92.4 fL (ref 80.0–100.0)
MPV: 10.5 fL (ref 7.5–12.5)
Monocytes Relative: 7.1 %
Neutro Abs: 4342 cells/uL (ref 1500–7800)
Neutrophils Relative %: 66.8 %
Platelets: 271 10*3/uL (ref 140–400)
RBC: 4.58 10*6/uL (ref 3.80–5.10)
RDW: 13.1 % (ref 11.0–15.0)
Total Lymphocyte: 25.5 %
WBC: 6.5 10*3/uL (ref 3.8–10.8)

## 2021-04-28 LAB — COMPLETE METABOLIC PANEL WITH GFR
AG Ratio: 1.5 (calc) (ref 1.0–2.5)
ALT: 16 U/L (ref 6–29)
AST: 16 U/L (ref 10–30)
Albumin: 3.9 g/dL (ref 3.6–5.1)
Alkaline phosphatase (APISO): 81 U/L (ref 31–125)
BUN: 14 mg/dL (ref 7–25)
CO2: 29 mmol/L (ref 20–32)
Calcium: 9.1 mg/dL (ref 8.6–10.2)
Chloride: 103 mmol/L (ref 98–110)
Creat: 0.71 mg/dL (ref 0.50–0.99)
Globulin: 2.6 g/dL (calc) (ref 1.9–3.7)
Glucose, Bld: 81 mg/dL (ref 65–99)
Potassium: 4 mmol/L (ref 3.5–5.3)
Sodium: 138 mmol/L (ref 135–146)
Total Bilirubin: 0.8 mg/dL (ref 0.2–1.2)
Total Protein: 6.5 g/dL (ref 6.1–8.1)
eGFR: 109 mL/min/{1.73_m2} (ref >=60)

## 2021-04-28 LAB — VITAMIN D 25 HYDROXY (VIT D DEFICIENCY, FRACTURES): Vit D, 25-Hydroxy: 38 ng/mL (ref 30–100)

## 2021-04-28 LAB — B12 AND FOLATE PANEL
Folate: 6.8 ng/mL
Vitamin B-12: 431 pg/mL (ref 200–1100)

## 2021-04-28 LAB — IRON,TIBC AND FERRITIN PANEL
%SAT: 20 % (calc) (ref 16–45)
Ferritin: 61 ng/mL (ref 16–232)
Iron: 61 ug/dL (ref 40–190)
TIBC: 301 mcg/dL (calc) (ref 250–450)

## 2021-04-28 LAB — FSH/LH
FSH: 7.3 m[IU]/mL
LH: 2.3 m[IU]/mL

## 2021-04-28 LAB — PROGESTERONE: Progesterone: 0.5 ng/mL

## 2021-04-28 LAB — ESTRADIOL: Estradiol: 45 pg/mL

## 2021-04-28 LAB — TSH: TSH: 1.97 mIU/L

## 2021-04-28 MED ORDER — WEGOVY 2.4 MG/0.75ML ~~LOC~~ SOAJ: 2.4000 mg | SUBCUTANEOUS | 1 refills | Status: DC

## 2021-04-28 MED ORDER — ATORVASTATIN CALCIUM 40 MG PO TABS: 40.0000 mg | ORAL_TABLET | Freq: Every day | ORAL | 3 refills | Status: DC

## 2021-04-28 MED ORDER — VENLAFAXINE HCL ER 37.5 MG PO CP24: ORAL_CAPSULE | ORAL | 4 refills | Status: DC

## 2021-04-28 NOTE — Patient Instructions (Signed)
Provigil  Chronic Fatigue Syndrome Chronic fatigue syndrome (CFS) is a condition that causes extreme tiredness (fatigue). This condition is also known as myalgic encephalomyelitis (ME). The fatigue in CFS does not improve with rest, and it gets worse with physical or mental activity. Several other symptoms may occur along with fatigue. Symptoms may come and go, but they generally last for months. Sometimes, CFS gets better over time. In other cases, it can be a lifelong condition. There is no cure, but there are many possible treatments. You will need to work with your health care providers to find a treatment plan that works best for you. What are the causes? The cause of this condition is not known. CFS may be caused by a combination of things. Possible causes include: An infection. An abnormal body defense system (abnormal immune system). Low blood pressure. Poor diet. Living with a lot of physical or emotional stress. What increases the risk? The following factors may make you more likely to develop this condition: Being female. Being 5-80 years old. Having a family history of CFS. What are the signs or symptoms? The main symptom of this condition is fatigue that is severe enough to interfere with day-to-day activities. This fatigue does not get better with rest, and it gets worse with physical or mental activity. There are eight other major symptoms of CFS: Discomfort and lack of energy (malaise) that lasts more than 24 hours after physical activity. Sleep that does not relieve fatigue (unrefreshing sleep). Short-term memory loss or confusion. Joint pain without redness or swelling. Muscle aches. Headaches. Painful and swollen glands (lymph nodes) in the neck or under the arms. Sore throat. Other symptoms can include: Cramps in the abdomen, constipation, or diarrhea (irritable bowel syndrome). Chills and night sweats. Vision changes. Dizziness and mental confusion (brain  fog). Clumsiness. Sensitivity to food, noise, or odors. Mood swings, depression, or anxiety attacks. How is this diagnosed? There are no tests that can diagnose this condition. Your health care provider will make the diagnosis based on: Your symptoms and medical history. A physical exam and a mental health exam. Tests to rule out other conditions. It is important to make sure that your symptoms are not caused by another medical condition. Tests may include lab tests or X-rays. For your health care provider to diagnose CFS: You must have had fatigue for at least 6 straight months. Fatigue must be your first symptom, and it must be severe enough to interfere with day-to-day activities. You must also have at least four of the eight other major symptoms of CFS. There must be no other cause found for the fatigue. How is this treated? There is no cure for CFS. The condition affects everyone differently. You will need to work with your team of health care providers to find the best treatments for your symptoms. Your team may include your primary care provider, physical and exercise therapists, and mental health therapists. Treatment may include: Having a regular bedtime routine to help improve your sleep. Avoiding caffeine, alcohol, and tobacco or nicotine products. Doing light exercise and stretching during the day. You may also want to try movement exercises, such as yoga or tai chi. Taking medicines to help you sleep or to relieve joint or muscle pain. Learning and practicing relaxation techniques, such as deep breathing and muscle relaxation. Using memory aids or doing brainteasers to improve memory and concentration. Getting care for your body and mental well-being, such as: Seeing a mental health therapist to evaluate and treat depression,  if necessary. Cognitive behavioral therapy (CBT). This therapy changes the way you think or act in response to the fatigue. This may help improve how you  feel. Trying massage therapy and acupuncture. Follow these instructions at home: Eating and drinking  Avoid caffeine and alcohol. Avoid heavy meals in the evening. Eat a healthy diet that includes foods such as vegetables, fruits, fish, and lean meats. Activity Rest as told by your health care provider. Avoid fatigue by pacing yourself during the day and getting enough sleep at night. Exercise regularly, as told by your health care provider. Go to bed and get up at the same time every day. Lifestyle Ask your health care provider whether you should keep a diary. Your health care provider will tell you what information to write in the diary. This may include when you have fatigue and how medicines and other behaviors or treatments help to reduce the fatigue. Consider joining a CFS support group. Avoid stress and use stress-reducing techniques that you learn in therapy. General instructions  Take over-the-counter and prescription medicines only as told by your health care provider. Do not use herbal or dietary supplements unless they are approved by your health care provider. Maintain a healthy weight. Do not use any products that contain nicotine or tobacco, such as cigarettes, e-cigarettes, and chewing tobacco. If you need help quitting, ask your health care provider. Keep all follow-up visits as told by your health care provider. This is important. Where to find more information Get more information or find a support group near you at one of these links: American Myalgic Encephalomyelitis and Chronic Fatigue Syndrome Society: ammes.org Centers for Disease Control and Prevention: http://www.wolf.info/ Contact a health care provider if: Your symptoms do not get better or they get worse. You feel angry, guilty, anxious, or depressed. Get help right away if: You have thoughts of self-harm. If you ever feel like you may hurt yourself or others, or have thoughts about taking your own life, get help  right away. Go to your nearest emergency department or: Call your local emergency services (911 in the [place] Call a suicide crisis helpline, such as the [place] at (519)882-6367 or 988 in the [place]. This is open 24 hours a day in the [place] Text the Crisis Text Line at (805) 168-1490 (in the [place].). Summary Chronic fatigue syndrome (CFS) is a condition that causes extreme tiredness (fatigue). This fatigue does not improve with rest, and it gets worse with physical or mental activity. There is no cure for CFS. The condition affects everyone differently. You will need to work with your team of health care providers to find the best treatments for your symptoms. Exercise regularly, as told by your health care provider. Avoid stress and use stress-reducing techniques that you learn in therapy. Contact a health care provider if your symptoms do not get better or they get worse. This information is not intended to replace advice given to you by your health care provider. Make sure you discuss any questions you have with your health care provider. Document Revised: 10/21/2020 Document Reviewed: 03/11/2019 Elsevier Patient Education  2022 [place] Maintenance, Female Adopting a healthy lifestyle and getting preventive care are important in promoting health and wellness. Ask your health care provider about: The right schedule for you to have regular tests and exams. Things you can do on your own to prevent diseases and keep yourself healthy. What should I know about diet, weight, and exercise? Eat a healthy  diet  Eat a diet that includes plenty of vegetables, fruits, low-fat dairy products, and lean protein. Do not eat a lot of foods that are high in solid fats, added sugars, or sodium. Maintain a healthy weight Body mass index (BMI) is used to identify weight problems. It estimates body fat based on height and weight. Your health care provider can help determine your  BMI and help you achieve or maintain a healthy weight. Get regular exercise Get regular exercise. This is one of the most important things you can do for your health. Most adults should: Exercise for at least 150 minutes each week. The exercise should increase your heart rate and make you sweat (moderate-intensity exercise). Do strengthening exercises at least twice a week. This is in addition to the moderate-intensity exercise. Spend less time sitting. Even light physical activity can be beneficial. Watch cholesterol and blood lipids Have your blood tested for lipids and cholesterol at 42 years of age, then have this test every 5 years. Have your cholesterol levels checked more often if: Your lipid or cholesterol levels are high. You are older than 42 years of age. You are at high risk for heart disease. What should I know about cancer screening? Depending on your health history and family history, you may need to have cancer screening at various ages. This may include screening for: Breast cancer. Cervical cancer. Colorectal cancer. Skin cancer. Lung cancer. What should I know about heart disease, diabetes, and high blood pressure? Blood pressure and heart disease High blood pressure causes heart disease and increases the risk of stroke. This is more likely to develop in people who have high blood pressure readings or are overweight. Have your blood pressure checked: Every 3-5 years if you are 40-36 years of age. Every year if you are 49 years old or older. Diabetes Have regular diabetes screenings. This checks your fasting blood sugar level. Have the screening done: Once every three years after age 42 if you are at a normal weight and have a low risk for diabetes. More often and at a younger age if you are overweight or have a high risk for diabetes. What should I know about preventing infection? Hepatitis B If you have a higher risk for hepatitis B, you should be screened for this  virus. Talk with your health care provider to find out if you are at risk for hepatitis B infection. Hepatitis C Testing is recommended for: Everyone born from [birth year] through 1966. Anyone with known risk factors for hepatitis C. Sexually transmitted infections (STIs) Get screened for STIs, including gonorrhea and chlamydia, if: You are sexually active and are younger than 42 years of age. You are older than 42 years of age and your health care provider tells you that you are at risk for this type of infection. Your sexual activity has changed since you were last screened, and you are at increased risk for chlamydia or gonorrhea. Ask your health care provider if you are at risk. Ask your health care provider about whether you are at high risk for HIV. Your health care provider may recommend a prescription medicine to help prevent HIV infection. If you choose to take medicine to prevent HIV, you should first get tested for HIV. You should then be tested every 3 months for as long as you are taking the medicine. Pregnancy If you are about to stop having your period (premenopausal) and you may become pregnant, seek counseling before you get pregnant. Take 400 to 800  micrograms (mcg) of folic acid every day if you become pregnant. Ask for birth control (contraception) if you want to prevent pregnancy. Osteoporosis and menopause Osteoporosis is a disease in which the bones lose minerals and strength with aging. This can result in bone fractures. If you are 28 years old or older, or if you are at risk for osteoporosis and fractures, ask your health care provider if you should: Be screened for bone loss. Take a calcium or vitamin D supplement to lower your risk of fractures. Be given hormone replacement therapy (HRT) to treat symptoms of menopause. Follow these instructions at home: Alcohol use Do not drink alcohol if: Your health care provider tells you not to drink. You are pregnant, may be pregnant,  or are planning to become pregnant. If you drink alcohol: Limit how much you have to: 0-1 drink a day. Know how much alcohol is in your drink. In the [place] one drink equals one 12 oz bottle of beer (355 mL), one 5 oz glass of wine (148 mL), or one 1 oz glass of hard liquor (44 mL). Lifestyle Do not use any products that contain nicotine or tobacco. These products include cigarettes, chewing tobacco, and vaping devices, such as e-cigarettes. If you need help quitting, ask your health care provider. Do not use street drugs. Do not share needles. Ask your health care provider for help if you need support or information about quitting drugs. General instructions Schedule regular health, dental, and eye exams. Stay current with your vaccines. Tell your health care provider if: You often feel depressed. You have ever been abused or do not feel safe at home. Summary Adopting a healthy lifestyle and getting preventive care are important in promoting health and wellness. Follow your health care provider's instructions about healthy diet, exercising, and getting tested or screened for diseases. Follow your health care provider's instructions on monitoring your cholesterol and blood pressure. This information is not intended to replace advice given to you by your health care provider. Make sure you discuss any questions you have with your health care provider. Document Revised: 08/17/2020 Document Reviewed: 08/17/2020 Elsevier Patient Education  [place].

## 2021-04-28 NOTE — Progress Notes (Signed)
Testosterone normal range.

## 2021-04-28 NOTE — Progress Notes (Signed)
Subjective:    Patient ID: Melanie Perez, female    DOB: [birth year], 41 y.o.   MRN: [id number]   HPI Pt is a 41 yo obese female who presents to the clinic for follow up and CPE.   Pt has been extremely fatigued since she had covid in October. She sleep well at night. Denies any depression. She wakes up feeling rested. She is taking more naps.   She continues to take wegovy. She having more loose stools but ok with side effect. She is walking more and goal weight is 200.   She does report some nipple itching at night. No rash.   .. Active Ambulatory Problems    Diagnosis Date Noted   PCOS (polycystic ovarian syndrome) 11/07/2012   Fibroids 02/11/2013   Abnormal weight gain 03/20/2013   Acne 03/20/2013   Venous insufficiency of left leg 04/24/2013   History of DVT of lower extremity 04/24/2013   Varicose veins 04/24/2013   Superficial thrombophlebitis 05/02/2013   Superficial thrombophlebitis of left leg 05/02/2013   Varicose veins of bilateral lower extremities with other complications 84/69/6295   BMI 34.0-34.9,adult 07/26/2013   DVT (deep venous thrombosis) (HCC) 10/24/2013   Excessive hair growth 01/12/2014   S/P laparoscopic cholecystectomy 02/19/2014   Abnormal uterine bleeding (AUB) 02/24/2015   Vitamin D deficiency 10/08/2015   Anxiety and depression 11/26/2016   Class 2 obesity due to excess calories without serious comorbidity with body mass index (BMI) of 37.0 to 37.9 in adult 11/26/2016   Medication side effect 04/10/2017   Suspicious nevus 10/06/2017   No energy 10/09/2017   Non-restorative sleep 10/09/2017   Snoring 10/09/2017   Apnea 10/09/2017   Left-sided chest pain 10/09/2017   Dyslipidemia (high LDL; low HDL) 10/10/2017   Elevated liver enzymes 10/10/2017   Mild obstructive sleep apnea 11/25/2017   Intertrigo 02/11/2020   Hemorrhoids 10/24/2020   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   Resolved Ambulatory Problems    Diagnosis Date Noted   BMI  37.0-37.9, adult 03/20/2013   Past Medical History:  Diagnosis Date   Abnormal Pap smear    History of DVT (deep vein thrombosis)    History of PCOS    History of uterine fibroid    HPV in female    .[name] Family History  Problem Relation Age of Onset   Kidney disease Maternal Grandfather    Hypertension Maternal Grandfather    Coronary artery disease Maternal Grandfather    Fibromyalgia Mother    .[name] Social History   Socioeconomic History   Marital status: Married    Spouse name: Not on file   Number of children: Not on file   Years of education: Not on file   Highest education level: Not on file  Occupational History   Occupation: Dance movement psychotherapist  Tobacco Use   Smoking status: Never   Smokeless tobacco: Never  Vaping Use   Vaping Use: Never used  Substance and Sexual Activity   Alcohol use: No   Drug use: No   Sexual activity: Yes    Partners: Male    Birth control/protection: I.U.D.  Other Topics Concern   Not on file  Social History Narrative   Not on file   Social Determinants of Health   Financial Resource Strain: Not on file  Food Insecurity: Not on file  Transportation Needs: Not on file  Physical Activity: Not on file  Stress: Not on file  Social Connections: Not on file  Intimate Partner Violence: Not on file  Review of Systems  All other systems reviewed and are negative.     Objective:   Physical Exam Vitals reviewed.  Constitutional:      Appearance: Normal appearance. She is obese.  HENT:     Head: Normocephalic.     Right Ear: Tympanic membrane normal.     Left Ear: Tympanic membrane normal.     Nose: Nose normal.     Mouth/Throat:     Mouth: Mucous membranes are moist.  Eyes:     Conjunctiva/sclera: Conjunctivae normal.  Neck:     Vascular: No carotid bruit.  Cardiovascular:     Rate and Rhythm: Normal rate and regular rhythm.  Pulmonary:     Effort: Pulmonary effort is normal.     Breath sounds: Normal breath sounds.   Abdominal:     General: There is no distension.     Palpations: Abdomen is soft.     Tenderness: There is no abdominal tenderness.  Musculoskeletal:     Cervical back: No tenderness.     Right lower leg: No edema.     Left lower leg: No edema.  Lymphadenopathy:     Cervical: No cervical adenopathy.  Neurological:     General: No focal deficit present.     Mental Status: She is alert.  Psychiatric:        Mood and Affect: Mood normal.  .. Depression screen [facility] 2/9 04/28/2021 10/23/2020 06/07/2019 06/08/2018 10/06/2017  Decreased Interest 0 0 0 0 1  Down, Depressed, Hopeless 0 0 1 0 1  PHQ - 2 Score 0 0 1 0 2  Altered sleeping - 3 1 3 3   Tired, decreased energy - 2 2 0 2  Change in appetite - 2 0 0 1  Feeling bad or failure about yourself  - 0 0 0 1  Trouble concentrating - 0 0 0 0  Moving slowly or fidgety/restless - 0 0 0 0  Suicidal thoughts - 0 0 0 0  PHQ-9 Score - 7 4 3 9   Difficult doing work/chores - Not difficult at all Not difficult at all Not difficult at all Not difficult at all           Assessment & Plan:  Marland KitchenMarland KitchenZyair was seen today for follow-up.  Diagnoses and all orders for this visit:  Routine physical examination  Visit for screening mammogram -     MM 3D SCREEN BREAST BILATERAL  Anxiety and depression -     venlafaxine XR (EFFEXOR-XR) 37.5 MG 24 hr capsule; TAKE 1 CAPSULE BY MOUTH ONCE DAILY WITH BREAKFAST  Post-COVID chronic fatigue  Class 2 obesity due to excess calories without serious comorbidity with body mass index (BMI) of 35.0 to 35.9 in adult -     WEGOVY 2.4 MG/0.75ML SOAJ; Inject 2.4 mg into the skin once a week.  Dyslipidemia (high LDL; low HDL) -     atorvastatin (LIPITOR) 40 MG tablet; Take 1 tablet (40 mg total) by mouth daily.   .. Discussed 150 minutes of exercise a week.  Encouraged vitamin D 1000 units and Calcium 1300mg  or 4 servings of dairy a day.  PHQ/GAD no concerns.  Refilled effexor. Reviewed labs.  Mammogram  ordered Pap UTD. Covid/flu UTD.   [name].Discussed low carb diet with 1500 calories and 80g of protein.  Exercising at least 150 minutes a week.  [employer]itness Pal could be a Microbiologist.  Continue wegovy. Increased to 2.4mg  dose.  Goal weight 200.  Follow up in 6  months.   Discussed chronic fatigue. No lab findings Consider provigil Like post covid fatigue

## 2021-04-29 ENCOUNTER — Telehealth: Payer: Self-pay

## 2021-04-29 NOTE — Telephone Encounter (Signed)
Medication: WEGOVY 2.4 MG/0.75ML SOAJ Prior authorization submitted via CoverMyMeds on 04/29/2021 [place] submission pending

## 2021-04-29 NOTE — Telephone Encounter (Signed)
This encounter was created in error - please disregard.

## 2021-04-29 NOTE — Telephone Encounter (Signed)
Medication: Semaglutide-Weight Management (WEGOVY) 1.7 MG/0.75ML SOAJ Prior authorization determination received Medication has been approved Approval dates: 04/21/2021-11/19/2021  Patient aware via: [place] aware: Yes Provider aware via this encounter

## 2021-07-03 ENCOUNTER — Encounter: Payer: Self-pay | Admitting: Physician Assistant

## 2021-07-05 MED ORDER — SCOPOLAMINE 1 MG/3DAYS TD PT72: 1.0000 | MEDICATED_PATCH | TRANSDERMAL | 0 refills | Status: DC

## 2021-07-06 ENCOUNTER — Encounter: Payer: Self-pay | Admitting: Physician Assistant

## 2021-07-06 NOTE — Telephone Encounter (Signed)
See other patient advise request.  ?

## 2021-07-06 NOTE — Telephone Encounter (Signed)
It sounds like she may have traumatized her ear drum. She likely needs appt ?

## 2021-09-20 ENCOUNTER — Telehealth: Payer: Self-pay | Admitting: General Practice

## 2021-09-20 NOTE — Telephone Encounter (Signed)
Transition Care Management Unsuccessful Follow-up Telephone Call  Date of discharge and from where:  09/17/21 from [facility]  Attempts:  1st Attempt  Reason for unsuccessful TCM follow-up call:  Left voice message

## 2021-09-21 NOTE — Telephone Encounter (Signed)
Transition Care Management Unsuccessful Follow-up Telephone Call  Date of discharge and from where:  09/17/21 from [facility]  Attempts:  2nd Attempt  Reason for unsuccessful TCM follow-up call:  Left voice message

## 2021-09-27 NOTE — Telephone Encounter (Signed)
Transition Care Management Unsuccessful Follow-up Telephone Call  Date of discharge and from where:  09/17/21 from [facility]  Attempts:  3rd Attempt  Reason for unsuccessful TCM follow-up call:  Left voice message

## 2021-10-27 ENCOUNTER — Ambulatory Visit: Payer: PRIVATE HEALTH INSURANCE | Admitting: Physician Assistant

## 2021-10-27 ENCOUNTER — Encounter: Payer: Self-pay | Admitting: Physician Assistant

## 2021-10-27 VITALS — BP 108/65 | HR 79 | Wt 233.0 lb

## 2021-10-27 DIAGNOSIS — D251 Intramural leiomyoma of uterus: Secondary | ICD-10-CM

## 2021-10-27 DIAGNOSIS — Z6834 Body mass index (BMI) 34.0-34.9, adult: Secondary | ICD-10-CM

## 2021-10-27 DIAGNOSIS — D252 Subserosal leiomyoma of uterus: Secondary | ICD-10-CM

## 2021-10-27 DIAGNOSIS — G4733 Obstructive sleep apnea (adult) (pediatric): Secondary | ICD-10-CM | POA: Diagnosis not present

## 2021-10-27 DIAGNOSIS — D250 Submucous leiomyoma of uterus: Secondary | ICD-10-CM

## 2021-10-27 DIAGNOSIS — E6609 Other obesity due to excess calories: Secondary | ICD-10-CM | POA: Diagnosis not present

## 2021-10-27 DIAGNOSIS — E282 Polycystic ovarian syndrome: Secondary | ICD-10-CM

## 2021-10-27 DIAGNOSIS — L430 Hypertrophic lichen planus: Secondary | ICD-10-CM

## 2021-10-27 DIAGNOSIS — B009 Herpesviral infection, unspecified: Secondary | ICD-10-CM

## 2021-10-27 MED ORDER — VALACYCLOVIR HCL 1 G PO TABS: 1000.0000 mg | ORAL_TABLET | Freq: Every day | ORAL | 3 refills | Status: DC

## 2021-10-27 MED ORDER — WEGOVY 2.4 MG/0.75ML ~~LOC~~ SOAJ: 2.4000 mg | SUBCUTANEOUS | 1 refills | Status: DC

## 2021-10-27 MED ORDER — CLOBETASOL PROPIONATE 0.05 % EX OINT: 1.0000 | TOPICAL_OINTMENT | Freq: Two times a day (BID) | CUTANEOUS | 11 refills | Status: DC | PRN

## 2021-10-27 NOTE — Progress Notes (Signed)
Established Patient Office Visit  Subjective   Patient ID: Melanie Perez, female    DOB: [birth year]  Age: 41 y.o. MRN: [id number]  Chief Complaint  Patient presents with   Weight Check    HPI Patient is a 41 year old obese female with past medical history of DVT, OSA, PCOS, uterine fibroids who presents to the clinic for medication refills.  Patient is doing well on Wegovy 2.4 mg.  She is tolerating with little side effects.  She continues to keep a fairly healthy diet.  She admits she has not been exercising.  She does feel like she has been stress eating a little more because of the stress at work.  She has lost 6 pounds in 6 months.  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  She has no complaints or concerns.  Patient did have to go to the emergency room a few months back for abdominal pain that she was found to have more fibroids and abnormal uterine bleeding.  She has seen GYN and they are wanting to do a hysterectomy.  She wonders thoughts on this.  She does continue to use clobetasol on her lichen planus of the vulva but very sparingly.  She is really not had any outbreaks recently.   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   Hemorrhoids 10/24/2020   Intertrigo 02/11/2020   Mild obstructive sleep apnea 11/25/2017   Dyslipidemia (high LDL; low HDL) 10/10/2017   Elevated liver enzymes 10/10/2017   No energy 10/09/2017   Non-restorative sleep 10/09/2017   Snoring 10/09/2017   Apnea 10/09/2017   Left-sided chest pain 10/09/2017   Suspicious nevus 10/06/2017   Medication side effect 04/10/2017   Anxiety and depression 11/26/2016   Class 2 obesity due to excess calories without serious comorbidity with body mass index (BMI) of 35.0 to 35.9 in adult 11/26/2016   Vitamin D deficiency 10/08/2015   Abnormal uterine bleeding  (AUB) 02/24/2015   S/P laparoscopic cholecystectomy 02/19/2014   Excessive hair growth 01/12/2014   DVT (deep venous thrombosis) ([place]) 10/24/2013   BMI 34.0-34.9,adult 07/26/2013   Varicose veins of bilateral lower extremities with other complications 19/50/9326   Superficial thrombophlebitis 05/02/2013   Superficial thrombophlebitis of left leg 05/02/2013   Venous insufficiency of left leg 04/24/2013   History of DVT of lower extremity 04/24/2013   Varicose veins 04/24/2013   Abnormal weight gain 03/20/2013   Acne 03/20/2013   Fibroids 02/11/2013   PCOS (polycystic ovarian syndrome) 11/07/2012   Past Medical History:  Diagnosis Date   Abnormal Pap smear    coplposcopy   History of DVT (deep vein thrombosis)    History of PCOS    History of uterine fibroid    HPV in female    Varicose veins    Family History  Problem Relation Age of Onset   Kidney disease Maternal Grandfather    Hypertension Maternal Grandfather    Coronary artery disease Maternal Grandfather    Fibromyalgia Mother    Allergies  Allergen Reactions   Contrave [Naltrexone-Bupropion Hcl Er] Anaphylaxis    Severe headaches   Belviq [Lorcaserin Hcl]     fatigued      Review of Systems  All other systems reviewed and are negative.     Objective:     BP 108/65   Pulse 79   Wt 233  lb (105.7 kg)   SpO2 98%   BMI 34.41 kg/m  BP Readings from Last 3 Encounters:  10/27/21 108/65  04/28/21 (!) 125/41  10/23/20 121/74   Wt Readings from Last 3 Encounters:  10/27/21 233 lb (105.7 kg)  04/28/21 239 lb 1.9 oz (108.5 kg)  10/23/20 256 lb (116.1 kg)      Physical Exam Constitutional:      Appearance: Normal appearance. She is obese.  HENT:     Head: Normocephalic.  Cardiovascular:     Rate and Rhythm: Normal rate.     Pulses: Normal pulses.     Heart sounds: Normal heart sounds.  Pulmonary:     Effort: Pulmonary effort is normal.     Breath sounds: Normal breath sounds.  Neurological:      General: No focal deficit present.     Mental Status: She is alert and oriented to person, place, and time.  Psychiatric:        Mood and Affect: Mood normal.        Assessment & Plan:  Marland KitchenMarland KitchenSydell was seen today for weight check.  Diagnoses and all orders for this visit:  Class 1 obesity due to excess calories without serious comorbidity with body mass index (BMI) of 34.0 to 34.9 in adult -     WEGOVY 2.4 MG/0.75ML SOAJ; Inject 2.4 mg into the skin once a week.  Mild obstructive sleep apnea  Intramural, submucous, and subserous leiomyoma of uterus  PCOS (polycystic ovarian syndrome) -     WEGOVY 2.4 MG/0.75ML SOAJ; Inject 2.4 mg into the skin once a week.  Hypertrophic lichen planus of vulva -     clobetasol ointment (TEMOVATE) 0.05 %; Apply 1 Application topically 2 (two) times daily as needed.  HSV-2 infection -     valACYclovir (VALTREX) 1000 MG tablet; Take 1 tablet (1,000 mg total) by mouth daily.   Pt continues to lose weight but a bit slower than before(down 6lbs in 6 months) Goal weight is 200lbs Discussed a little more exercise and watching portion/choices a little more Continue wegovy 2.'4mg'$  weekly sent refills Follow up in 6 months  Refilled valtrex for HSV2 suppression  Refilled clobetosol for as needed treatment of lichen planus flares  Encouraged patient to consider hysterectomy due to multiple fibroids.     Iran Planas, PA-C

## 2021-12-15 ENCOUNTER — Encounter: Payer: Self-pay | Admitting: Physician Assistant

## 2021-12-30 ENCOUNTER — Telehealth: Payer: Self-pay

## 2021-12-30 NOTE — Telephone Encounter (Addendum)
Initiated Prior authorization KLT:[id number] 2.'4MG'$ /0.75ML auto-injectors Via: Covermymeds Case/Key:[id number] Status: approved  as of 9/201/23 Reason: approved through 06/30/2022 Notified Pt via: Mychart

## 2022-01-29 DIAGNOSIS — Z90710 Acquired absence of both cervix and uterus: Secondary | ICD-10-CM | POA: Insufficient documentation

## 2022-01-31 ENCOUNTER — Telehealth: Payer: Self-pay | Admitting: General Practice

## 2022-01-31 NOTE — Telephone Encounter (Signed)
Transition Care Management Follow-up Telephone Call Date of discharge and from where: 01/29/22 from [facility] How have you been since you were released from the hospital? Doing ok. Had an hysterectomy.  Any questions or concerns? No  Items Reviewed: Did the pt receive and understand the discharge instructions provided? Yes  Medications obtained and verified? Yes  Other? No  Any new allergies since your discharge? No  Dietary orders reviewed? Yes Do you have support at home? Yes   Home Care and Equipment/Supplies: Were home health services ordered? no  Functional Questionnaire: (I = Independent and D = Dependent) ADLs: I  Bathing/Dressing- I  Meal Prep- I  Eating- I  Maintaining continence- I  Transferring/Ambulation- I  Managing Meds- I  Follow up appointments reviewed:  PCP Hospital f/u appt confirmed? No   Specialist Hospital f/u appt confirmed? Yes  Scheduled to see the gyn on 02/07/22. Are transportation arrangements needed? No  If their condition worsens, is the pt aware to call PCP or go to the Emergency Dept.? Yes Was the patient provided with contact information for the PCP's office or ED? Yes Was to pt encouraged to call back with questions or concerns? Yes

## 2022-02-21 ENCOUNTER — Telehealth: Payer: Self-pay | Admitting: General Practice

## 2022-02-21 NOTE — Telephone Encounter (Signed)
Transition Care Management Unsuccessful Follow-up Telephone Call  Date of discharge and from where:  02/20/22 from [facility]  Attempts:  1st Attempt  Reason for unsuccessful TCM follow-up call:  Left voice message

## 2022-02-23 NOTE — Telephone Encounter (Signed)
Transition Care Management Unsuccessful Follow-up Telephone Call  Date of discharge and from where:  02/20/22 from [facility]  Attempts:  2nd Attempt  Reason for unsuccessful TCM follow-up call:  Left voice message

## 2022-02-28 NOTE — Telephone Encounter (Signed)
Transition Care Management Unsuccessful Follow-up Telephone Call  Date of discharge and from where:  02/20/22 from [facility]  Attempts:  3rd Attempt  Reason for unsuccessful TCM follow-up call:  Left voice message

## 2022-03-02 ENCOUNTER — Encounter: Payer: Self-pay | Admitting: Physician Assistant

## 2022-03-07 MED ORDER — WEGOVY 1 MG/0.5ML ~~LOC~~ SOAJ: 1.0000 mg | SUBCUTANEOUS | 0 refills | Status: DC

## 2022-03-07 NOTE — Telephone Encounter (Signed)
Currently on 2.4 mg dosage. Please advise.

## 2022-03-09 MED ORDER — WEGOVY 1.7 MG/0.75ML ~~LOC~~ SOAJ: 1.7000 mg | SUBCUTANEOUS | 0 refills | Status: DC

## 2022-03-09 NOTE — Telephone Encounter (Signed)
1.7 mg dosage pended. Please advise.

## 2022-03-09 NOTE — Addendum Note (Signed)
Addended byAnnamaria Helling on: 03/09/2022 11:53 AM   Modules accepted: Orders

## 2022-03-14 ENCOUNTER — Encounter: Payer: Self-pay | Admitting: Physician Assistant

## 2022-03-15 MED ORDER — WEGOVY 1.7 MG/0.75ML ~~LOC~~ SOAJ: 1.7000 mg | SUBCUTANEOUS | 0 refills | Status: DC

## 2022-04-20 ENCOUNTER — Ambulatory Visit: Payer: PRIVATE HEALTH INSURANCE | Admitting: Physician Assistant

## 2022-04-20 ENCOUNTER — Encounter: Payer: Self-pay | Admitting: Physician Assistant

## 2022-04-20 VITALS — BP 117/68 | HR 80 | Ht 69.0 in | Wt 237.0 lb

## 2022-04-20 DIAGNOSIS — E282 Polycystic ovarian syndrome: Secondary | ICD-10-CM

## 2022-04-20 DIAGNOSIS — E785 Hyperlipidemia, unspecified: Secondary | ICD-10-CM

## 2022-04-20 DIAGNOSIS — Z1231 Encounter for screening mammogram for malignant neoplasm of breast: Secondary | ICD-10-CM | POA: Diagnosis not present

## 2022-04-20 DIAGNOSIS — E6609 Other obesity due to excess calories: Secondary | ICD-10-CM

## 2022-04-20 DIAGNOSIS — Z6835 Body mass index (BMI) 35.0-35.9, adult: Secondary | ICD-10-CM

## 2022-04-20 MED ORDER — ATORVASTATIN CALCIUM 40 MG PO TABS: 40.0000 mg | ORAL_TABLET | Freq: Every day | ORAL | 3 refills | Status: DC

## 2022-04-20 MED ORDER — WEGOVY 2.4 MG/0.75ML ~~LOC~~ SOAJ: 2.4000 mg | SUBCUTANEOUS | 1 refills | Status: DC

## 2022-04-20 NOTE — Progress Notes (Signed)
Established Patient Office Visit  Subjective   Patient ID: Melanie Perez, female    DOB: 1980/10/19  Age: 42 y.o. MRN: [id number]  Chief Complaint  Patient presents with   Follow-up    HPI Pt is a 42 yo obese female who presents to the clinic to follow up on wegovy.   She stopped because of a death in the family. She then had trouble getting medication. She has been on it 3 weeks. She had gained weight being off it. She is now almost back down to weight in July 2023. She is ready to go up on dose. She is motatvated to exercise and eat better. She is tolerating medication well.   She had hysterectomy and fibroid removal as well and is doing good.  .. Active Ambulatory Problems    Diagnosis Date Noted   PCOS (polycystic ovarian syndrome) 11/07/2012   Fibroids 02/11/2013   Abnormal weight gain 03/20/2013   Acne 03/20/2013   Venous insufficiency of left leg 04/24/2013   History of DVT of lower extremity 04/24/2013   Varicose veins 04/24/2013   Superficial thrombophlebitis 05/02/2013   Superficial thrombophlebitis of left leg 05/02/2013   Varicose veins of bilateral lower extremities with other complications 09/73/5329   BMI 34.0-34.9,adult 07/26/2013   DVT (deep venous thrombosis) ([facility]) 10/24/2013   Excessive hair growth 01/12/2014   S/P laparoscopic cholecystectomy 02/19/2014   Abnormal uterine bleeding (AUB) 02/24/2015   Vitamin D deficiency 10/08/2015   Anxiety and depression 11/26/2016   Class 2 obesity due to excess calories without serious comorbidity with body mass index (BMI) of 35.0 to 35.9 in adult 11/26/2016   Medication side effect 04/10/2017   Suspicious nevus 10/06/2017   No energy 10/09/2017   Non-restorative sleep 10/09/2017   Snoring 10/09/2017   Apnea 10/09/2017   Left-sided chest pain 10/09/2017   Dyslipidemia (high LDL; low HDL) 10/10/2017   Elevated liver enzymes 10/10/2017   Mild obstructive sleep apnea 11/25/2017   Intertrigo 02/11/2020    Hemorrhoids 10/24/2020   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    Objective:     BP 117/68   Pulse 80   Ht '5\' 9"'$  (1.753 m)   Wt 237 lb (107.5 kg)   SpO2 99%   BMI 35.00 kg/m  BP Readings from Last 3 Encounters:  04/20/22 117/68  10/27/21 108/65  04/28/21 (!) 125/41   Wt Readings from Last 3 Encounters:  04/20/22 237 lb (107.5 kg)  10/27/21 233 lb (105.7 kg)  04/28/21 239 lb 1.9 oz (108.5 kg)    ..    04/28/2021    7:39 AM 10/23/2020    3:39 PM 06/07/2019    8:17 AM 06/08/2018    3:02 PM 10/06/2017    8:36 AM  Depression screen PHQ 2/9  Decreased Interest 0 0 0 0 1  Down, Depressed, Hopeless 0 0 1 0 1  PHQ - 2 Score 0 0 1 0 2  Altered sleeping  '3 1 3 3  '$ Tired, decreased energy  2  2 0 2  Change in appetite  2 0 0 1  Feeling bad or failure about yourself   0 0 0 1  Trouble concentrating  0 0 0 0  Moving slowly or fidgety/restless  0 0 0 0  Suicidal thoughts  0 0 0 0  PHQ-9 Score  '7 4 3 9  '$ Difficult doing work/chores  Not difficult at all Not difficult at all Not difficult at all Not difficult at all     Physical Exam Vitals reviewed.  Constitutional:      Appearance: Normal appearance. She is obese.  HENT:     Head: Normocephalic.  Cardiovascular:     Rate and Rhythm: Normal rate.  Pulmonary:     Effort: Pulmonary effort is normal.  Neurological:     Mental Status: She is alert and oriented to person, place, and time.  Psychiatric:        Mood and Affect: Mood normal.        Assessment & Plan:  Marland KitchenMarland KitchenJohara was seen today for follow-up.  Diagnoses and  all orders for this visit:  Class 2 obesity due to excess calories without serious comorbidity with body mass index (BMI) of 35.0 to 35.9 in adult -     WEGOVY 2.4 MG/0.75ML SOAJ; Inject 2.4 mg into the skin once a week.  Visit for screening mammogram -     MM 3D SCREEN BREAST BILATERAL  Dyslipidemia (high LDL; low HDL) -     atorvastatin (LIPITOR) 40 MG tablet; Take 1 tablet (40 mg total) by mouth daily.  PCOS (polycystic ovarian syndrome) -     WEGOVY 2.4 MG/0.75ML SOAJ; Inject 2.4 mg into the skin once a week.   Tolerating well Goal weight is under 200 Next goal in 6 months is 215 .Marland KitchenDiscussed low carb diet with 1500 calories and 80g of protein.  Exercising at least 150 minutes a week.  My [employer]itness Pal could be a Microbiologist.  Start wegovy 2.'4mg'$  weekly Follow up in 6 months    Iran Planas, PA-C

## 2022-08-31 ENCOUNTER — Encounter: Payer: Self-pay | Admitting: Physician Assistant

## 2022-09-06 NOTE — Telephone Encounter (Signed)
Hi Key, have you seen this one come across?

## 2022-09-09 ENCOUNTER — Telehealth: Payer: Self-pay

## 2022-09-09 NOTE — Telephone Encounter (Addendum)
Initiated Prior authorization ZOX:WRUEAV 1MG /0.5ML auto-injectors  Via: Covermymeds Case/Key:[id number] Status: approved as of 09/09/22 Reason:Authorization Expiration Date: March 11, 2023. Notified Pt via: Mychart

## 2022-09-12 ENCOUNTER — Other Ambulatory Visit: Payer: Self-pay

## 2022-09-12 DIAGNOSIS — F32A Depression, unspecified: Secondary | ICD-10-CM

## 2022-09-12 MED ORDER — VENLAFAXINE HCL ER 37.5 MG PO CP24: ORAL_CAPSULE | ORAL | 0 refills | Status: DC

## 2022-09-23 LAB — HM MAMMOGRAPHY

## 2022-10-19 ENCOUNTER — Encounter: Payer: Self-pay | Admitting: Physician Assistant

## 2022-10-19 ENCOUNTER — Ambulatory Visit (INDEPENDENT_AMBULATORY_CARE_PROVIDER_SITE_OTHER): Payer: PRIVATE HEALTH INSURANCE | Admitting: Physician Assistant

## 2022-10-19 VITALS — BP 121/67 | HR 88 | Ht 69.0 in | Wt 241.5 lb

## 2022-10-19 DIAGNOSIS — E6609 Other obesity due to excess calories: Secondary | ICD-10-CM | POA: Diagnosis not present

## 2022-10-19 DIAGNOSIS — G4733 Obstructive sleep apnea (adult) (pediatric): Secondary | ICD-10-CM | POA: Diagnosis not present

## 2022-10-19 DIAGNOSIS — Z6835 Body mass index (BMI) 35.0-35.9, adult: Secondary | ICD-10-CM

## 2022-10-19 DIAGNOSIS — E785 Hyperlipidemia, unspecified: Secondary | ICD-10-CM | POA: Diagnosis not present

## 2022-10-19 MED ORDER — PHENTERMINE-TOPIRAMATE 11.25-69 MG PO CP24
1.0000 | ORAL_CAPSULE | Freq: Every morning | ORAL | 0 refills | Status: DC
Start: 2022-11-19 — End: 2023-01-25

## 2022-10-19 MED ORDER — PHENTERMINE-TOPIRAMATE ER 3.75-23 MG PO CP24: 1.0000 | ORAL_CAPSULE | Freq: Every morning | ORAL | 0 refills | Status: DC

## 2022-10-19 MED ORDER — PHENTERMINE-TOPIRAMATE ER 7.5-46 MG PO CP24: 1.0000 | ORAL_CAPSULE | Freq: Every morning | ORAL | 0 refills | Status: DC

## 2022-10-19 MED ORDER — PHENTERMINE-TOPIRAMATE 15-92 MG PO CP24
1.0000 | ORAL_CAPSULE | Freq: Every morning | ORAL | 0 refills | Status: DC
Start: 2022-12-20 — End: 2023-01-25

## 2022-10-19 NOTE — Progress Notes (Signed)
Established Patient Office Visit  Subjective   Patient ID: Melanie Perez, female    DOB: November 05, 1980  Age: 42 y.o. MRN: [id number]  Chief Complaint  Patient presents with   weight management follow up    Patient request  to stop Wegovy due to unacceptable side effects     HPI Pt is a 42 yo obese female who presents to the clinic to discuss weight loss medications. She has done really well on wegovy and lost almost 70lbs on medication. She leads much more active life now and eats differently. Since her hysterectomy wegovy side effects have been much worse with more nausea, burping, bloating. She would like to stop it. She is worried about gaining the weight back. She has tried cutting back to 1mg  but still same symptoms.    Active Ambulatory Problems    Diagnosis Date Noted   PCOS (polycystic ovarian syndrome) 11/07/2012   Fibroids 02/11/2013   Abnormal weight gain 03/20/2013   Acne 03/20/2013   Venous insufficiency of left leg 04/24/2013   History of DVT of lower extremity 04/24/2013   Varicose veins 04/24/2013   Superficial thrombophlebitis 05/02/2013   Superficial thrombophlebitis of left leg 05/02/2013   Varicose veins of bilateral lower extremities with other complications 06/10/2013   BMI 34.0-34.9,adult 07/26/2013   DVT (deep venous thrombosis) ([facility]) 10/24/2013   Excessive hair growth 01/12/2014   S/P laparoscopic cholecystectomy 02/19/2014   Abnormal uterine bleeding (AUB) 02/24/2015   Vitamin D deficiency 10/08/2015   Anxiety and depression 11/26/2016   Class 2 obesity due to excess calories without serious comorbidity with body mass index (BMI) of 35.0 to 35.9 in adult 11/26/2016   Medication side effect 04/10/2017   Suspicious nevus 10/06/2017   No energy 10/09/2017   Non-restorative sleep 10/09/2017   Snoring 10/09/2017   Apnea 10/09/2017   Left-sided chest pain 10/09/2017   Dyslipidemia (high LDL; low HDL) 10/10/2017   Elevated liver enzymes 10/10/2017   Mild  obstructive sleep apnea 11/25/2017   Intertrigo 02/11/2020   Hemorrhoids 10/24/2020   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    Objective:     BP 121/67   Pulse 88   Ht 5\' 9"  (1.753 m)   Wt 241 lb 8 oz (109.5 kg)   SpO2 96%   BMI 35.66 kg/m  BP Readings from Last 3 Encounters:  10/19/22 121/67  04/20/22 117/68  10/27/21 108/65   Wt Readings from Last 3 Encounters:  10/19/22 241 lb 8 oz (109.5 kg)  04/20/22 237 lb (107.5 kg)  10/27/21 233 lb (105.7 kg)      Physical Exam Constitutional:      Appearance: Normal appearance. She is obese.  HENT:     Head: Normocephalic.  Cardiovascular:     Rate and Rhythm: Normal rate and regular rhythm.  Pulmonary:     Effort: Pulmonary effort is normal.     Breath sounds: Normal breath sounds.  Musculoskeletal:     Right lower leg: No edema.     Left  lower leg: No edema.  Neurological:     General: No focal deficit present.     Mental Status: She is alert.  Psychiatric:        Mood and Affect: Mood normal.        Assessment & Plan:  Marland KitchenMarland KitchenLonda was seen today for weight management follow up.  Diagnoses and all orders for this visit:  Class 2 obesity due to excess calories without serious comorbidity with body mass index (BMI) of 35.0 to 35.9 in adult -     Phentermine-Topiramate 3.75-23 MG CP24; Take 1 capsule by mouth every morning. -     Phentermine-Topiramate 7.5-46 MG CP24; Take 1 capsule by mouth every morning. -     Phentermine-Topiramate 11.25-69 MG CP24; Take 1 capsule by mouth every morning. -      Phentermine-Topiramate 15-92 MG CP24; Take 1 capsule by mouth every morning.  Mild obstructive sleep apnea  Dyslipidemia (high LDL; low HDL)   Discussed weight loss medications Agreed qsymia would be a good one to start Discussed side effects and titration Follow up in 3 months Continue with 150 minutes of exercise a week and 1300 to 1500 calorie diet  Return in about 3 months (around 01/19/2023).    Tandy Gaw, PA-C

## 2022-10-21 ENCOUNTER — Encounter: Payer: Self-pay | Admitting: Physician Assistant

## 2022-11-06 ENCOUNTER — Encounter: Payer: Self-pay | Admitting: Physician Assistant

## 2022-11-08 MED ORDER — TOPIRAMATE 50 MG PO TABS: ORAL_TABLET | ORAL | 0 refills | Status: DC

## 2022-11-08 MED ORDER — PHENTERMINE HCL 15 MG PO CAPS: 15.0000 mg | ORAL_CAPSULE | ORAL | 0 refills | Status: DC

## 2022-12-31 ENCOUNTER — Other Ambulatory Visit: Payer: Self-pay | Admitting: Physician Assistant

## 2022-12-31 DIAGNOSIS — F419 Anxiety disorder, unspecified: Secondary | ICD-10-CM

## 2023-01-11 ENCOUNTER — Other Ambulatory Visit: Payer: Self-pay | Admitting: Physician Assistant

## 2023-01-11 DIAGNOSIS — B009 Herpesviral infection, unspecified: Secondary | ICD-10-CM

## 2023-01-11 DIAGNOSIS — L430 Hypertrophic lichen planus: Secondary | ICD-10-CM

## 2023-01-23 ENCOUNTER — Ambulatory Visit: Payer: PRIVATE HEALTH INSURANCE | Admitting: Family Medicine

## 2023-01-25 ENCOUNTER — Telehealth: Payer: Self-pay

## 2023-01-25 ENCOUNTER — Ambulatory Visit (INDEPENDENT_AMBULATORY_CARE_PROVIDER_SITE_OTHER): Payer: PRIVATE HEALTH INSURANCE | Admitting: Physician Assistant

## 2023-01-25 VITALS — BP 126/69 | HR 78 | Ht 69.0 in | Wt 256.0 lb

## 2023-01-25 DIAGNOSIS — E66812 Obesity, class 2: Secondary | ICD-10-CM | POA: Diagnosis not present

## 2023-01-25 DIAGNOSIS — F419 Anxiety disorder, unspecified: Secondary | ICD-10-CM | POA: Diagnosis not present

## 2023-01-25 DIAGNOSIS — R442 Other hallucinations: Secondary | ICD-10-CM

## 2023-01-25 DIAGNOSIS — E6609 Other obesity due to excess calories: Secondary | ICD-10-CM

## 2023-01-25 DIAGNOSIS — Z6837 Body mass index (BMI) 37.0-37.9, adult: Secondary | ICD-10-CM

## 2023-01-25 DIAGNOSIS — F32A Depression, unspecified: Secondary | ICD-10-CM

## 2023-01-25 MED ORDER — PHENTERMINE HCL 15 MG PO CAPS
15.0000 mg | ORAL_CAPSULE | ORAL | 0 refills | Status: DC
Start: 2023-01-25 — End: 2023-05-03

## 2023-01-25 MED ORDER — VENLAFAXINE HCL ER 75 MG PO CP24
75.0000 mg | ORAL_CAPSULE | Freq: Every day | ORAL | 1 refills | Status: DC
Start: 2023-01-25 — End: 2023-05-03

## 2023-01-25 MED ORDER — TOPIRAMATE 50 MG PO TABS
ORAL_TABLET | ORAL | 1 refills | Status: DC
Start: 2023-01-25 — End: 2023-05-03

## 2023-01-25 MED ORDER — ZEPBOUND 2.5 MG/0.5ML ~~LOC~~ SOAJ
2.5000 mg | SUBCUTANEOUS | 0 refills | Status: DC
Start: 2023-01-25 — End: 2023-02-22

## 2023-01-25 NOTE — Progress Notes (Unsigned)
Established Patient Office Visit  Subjective   Patient ID: Melanie Perez, female    DOB: [birth year]  Age: 42 y.o. MRN: [id number]  Chief Complaint  Patient presents with   Medical Management of Chronic Issues    Wgt  check  pt claims to smell smoke and increased anxiety and is not sure if it perimenopausal or the phentermine     HPI Pt is a 42 yo obese female who presents to the clinic to follow up on weight loss.   She has started phentermine and topamax. She has not lost any weight but she does feel better. She is sleeping better and more alert during the day. She feels like she is eating less but scales do not show it. She is not exercising.   She has a weird symptoms over last 3 months of smelling smoke in the evening. No headaches, vision changes, speech changes or weakness.   She is doing ok with mood but she is under a lot of stress mostly around her daughter and her grandson. She wants to help them but feels like her daughter is not let her help.    Active Ambulatory Problems    Diagnosis Date Noted   PCOS (polycystic ovarian syndrome) 11/07/2012   Fibroids 02/11/2013   Abnormal weight gain 03/20/2013   Acne 03/20/2013   Venous insufficiency of left leg 04/24/2013   History of DVT of lower extremity 04/24/2013   Varicose veins 04/24/2013   Superficial thrombophlebitis 05/02/2013   Superficial thrombophlebitis of left leg 05/02/2013   Varicose veins of bilateral lower extremities with other complications 06/10/2013   BMI 34.0-34.9,adult 07/26/2013   DVT (deep venous thrombosis) ([facility]) 10/24/2013   Excessive hair growth 01/12/2014   S/P laparoscopic cholecystectomy 02/19/2014   Abnormal uterine bleeding (AUB) 02/24/2015   Vitamin D deficiency 10/08/2015   Anxiety and depression 11/26/2016   Class 2 obesity due to excess calories without serious comorbidity with body mass index (BMI) of 37.0 to 37.9 in adult 11/26/2016   Medication side effect 04/10/2017   Suspicious  nevus 10/06/2017   No energy 10/09/2017   Non-restorative sleep 10/09/2017   Snoring 10/09/2017   Apnea 10/09/2017   Left-sided chest pain 10/09/2017   Dyslipidemia (high LDL; low HDL) 10/10/2017   Elevated liver enzymes 10/10/2017   Mild obstructive sleep apnea 11/25/2017   Intertrigo 02/11/2020   Hemorrhoids 10/24/2020   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    Objective:     BP 126/69   Pulse 78   Ht 5\' 9"  (1.753 m)   Wt 256 lb (116.1 kg)   SpO2 99%   BMI 37.80 kg/m  BP Readings from Last 3 Encounters:  01/25/23 126/69  10/19/22 121/67  04/20/22 117/68   Wt Readings from Last 3 Encounters:  01/25/23 256 lb (116.1 kg)  10/19/22 241 lb 8 oz (109.5 kg)  04/20/22 237 lb (107.5 kg)    ..    01/25/2023    7:44 AM 04/20/2022  1:55 PM 04/28/2021    7:39 AM 10/23/2020    3:39 PM 06/07/2019    8:17 AM  Depression screen PHQ 2/9  Decreased Interest 2 0 0 0 0  Down, Depressed, Hopeless 1 0 0 0 1  PHQ - 2 Score 3 0 0 0 1  Altered sleeping 0 1  3 1   Tired, decreased energy 2 1  2 2   Change in appetite 1 1  2  0  Feeling bad or failure about yourself  0 0  0 0  Trouble concentrating 0 0  0 0  Moving slowly or fidgety/restless 0 0  0 0  Suicidal thoughts 0 0  0 0  PHQ-9 Score 6 3  7 4   Difficult doing work/chores Not difficult at all Not difficult at all  Not difficult at all Not difficult at all   ..    01/25/2023    7:46 AM 04/20/2022    1:57 PM 10/23/2020    3:40 PM 06/07/2019    8:18 AM  GAD 7 : Generalized Anxiety Score  Nervous,  Anxious, on Edge 2 1 2 1   Control/stop worrying 2 0 0 1  Worry too much - different things 2 0 0 1  Trouble relaxing 3 1 0 0  Restless 3 0 0 0  Easily annoyed or irritable 3 0 0 0  Afraid - awful might happen 2 0 0 0  Total GAD 7 Score 17 2 2 3   Anxiety Difficulty Not difficult at all Not difficult at all Not difficult at all Not difficult at all      Physical Exam Constitutional:      Appearance: Normal appearance. She is obese.  HENT:     Head: Normocephalic.     Right Ear: Tympanic membrane normal.     Left Ear: Tympanic membrane normal.     Nose: Nose normal. No congestion or rhinorrhea.     Mouth/Throat:     Mouth: Mucous membranes are moist.  Eyes:     Conjunctiva/sclera: Conjunctivae normal.  Cardiovascular:     Rate and Rhythm: Normal rate and regular rhythm.  Pulmonary:     Effort: Pulmonary effort is normal.     Breath sounds: Normal breath sounds.  Musculoskeletal:     Cervical back: Normal range of motion and neck supple. No tenderness.  Lymphadenopathy:     Cervical: No cervical adenopathy.  Neurological:     General: No focal deficit present.     Mental Status: She is alert and oriented to person, place, and time.  Psychiatric:        Mood and Affect: Mood normal.       The 10-year ASCVD risk score (Arnett DK, et al., 2019) is: 0.6%    Assessment & Plan:  Marland KitchenMarland KitchenVanesia was seen today for medical management of chronic issues.  Diagnoses and all orders for this visit:  Phantosmia  Class 2 obesity due to excess calories without serious comorbidity with body mass index (BMI) of 37.0 to 37.9 in adult -     tirzepatide (ZEPBOUND) 2.5 MG/0.5ML Pen; Inject 2.5 mg into the skin once a week. -     phentermine 15 MG capsule; Take 1 capsule (15 mg total) by mouth every morning. -     topiramate (TOPAMAX) 50 MG tablet; Take one tablet daily in the evening.  Anxiety and depression -     venlafaxine XR (EFFEXOR XR) 75 MG 24 hr capsule; Take 1 capsule (75 mg total)  by  mouth daily with breakfast.   Refilled effexor and increased to 75mg  daily.   Discussed weight Continue on phentermine and topamax Added zepbound Discussed side effects and how to titrate up Follow up in 3 months  Unclear etiology of phantosmia ? If SE of topamax Trial of flonase for congestion Follow up as needed or if symptoms worsen No neuro signs to suggest brain tumor     Tandy Gaw, PA-C

## 2023-01-25 NOTE — Telephone Encounter (Signed)
Initiated Prior authorization ZOX:WRUEAVWU 2.5MG /0.5ML pen-injectors Via: Covermymeds Case/Key:[id number] Status: Pending as of 01/25/23 Reason: Notified Pt via: Mychart

## 2023-01-31 ENCOUNTER — Encounter: Payer: Self-pay | Admitting: Physician Assistant

## 2023-02-18 ENCOUNTER — Encounter: Payer: Self-pay | Admitting: Physician Assistant

## 2023-02-22 MED ORDER — ZEPBOUND 5 MG/0.5ML ~~LOC~~ SOAJ
5.0000 mg | SUBCUTANEOUS | 0 refills | Status: DC
Start: 2023-02-22 — End: 2023-05-03

## 2023-02-22 MED ORDER — ZEPBOUND 7.5 MG/0.5ML ~~LOC~~ SOAJ: 7.5000 mg | SUBCUTANEOUS | 0 refills | Status: DC

## 2023-03-29 ENCOUNTER — Other Ambulatory Visit: Payer: Self-pay | Admitting: Medical Genetics

## 2023-04-20 ENCOUNTER — Other Ambulatory Visit: Payer: Self-pay | Admitting: Physician Assistant

## 2023-05-03 ENCOUNTER — Ambulatory Visit: Payer: PRIVATE HEALTH INSURANCE | Admitting: Physician Assistant

## 2023-05-03 VITALS — BP 123/67 | HR 93 | Ht 69.0 in | Wt 262.0 lb

## 2023-05-03 DIAGNOSIS — E785 Hyperlipidemia, unspecified: Secondary | ICD-10-CM | POA: Diagnosis not present

## 2023-05-03 DIAGNOSIS — F419 Anxiety disorder, unspecified: Secondary | ICD-10-CM

## 2023-05-03 DIAGNOSIS — E66812 Obesity, class 2: Secondary | ICD-10-CM

## 2023-05-03 DIAGNOSIS — E282 Polycystic ovarian syndrome: Secondary | ICD-10-CM | POA: Diagnosis not present

## 2023-05-03 DIAGNOSIS — E6609 Other obesity due to excess calories: Secondary | ICD-10-CM

## 2023-05-03 DIAGNOSIS — Z6838 Body mass index (BMI) 38.0-38.9, adult: Secondary | ICD-10-CM

## 2023-05-03 DIAGNOSIS — F32A Depression, unspecified: Secondary | ICD-10-CM

## 2023-05-03 MED ORDER — VENLAFAXINE HCL ER 75 MG PO CP24: 75.0000 mg | ORAL_CAPSULE | Freq: Every day | ORAL | 3 refills | Status: DC

## 2023-05-03 MED ORDER — ZEPBOUND 10 MG/0.5ML ~~LOC~~ SOAJ
10.0000 mg | SUBCUTANEOUS | 0 refills | Status: DC
Start: 2023-05-03 — End: 2023-06-07

## 2023-05-03 NOTE — Progress Notes (Signed)
Established Patient Office Visit  Subjective   Patient ID: Melanie Perez, female    DOB: 08-21-1980  Age: 43 y.o. MRN: [id number]  Chief Complaint  Patient presents with   Medical Management of Chronic Issues    Phantosmia 3 mo fup     HPI Pt is a 43 yo obesese female with anxiety, depression who needs medication refills.   Her mood is much better. She feels like she is able to handle stress a lot more. She denies and SI/HC. She is much happier.   She continues on zepbound but not having the weight loss she had previously on wegovy and saxenda. She feels like it does not stop her cravings as much as the others. She admits she is not exercising. No SEs/   .[name] Active Ambulatory Problems    Diagnosis Date Noted   PCOS (polycystic ovarian syndrome) 11/07/2012   Fibroids 02/11/2013   Abnormal weight gain 03/20/2013   Acne 03/20/2013   Venous insufficiency of left leg 04/24/2013   History of DVT of lower extremity 04/24/2013   Varicose veins 04/24/2013   Superficial thrombophlebitis 05/02/2013   Superficial thrombophlebitis of left leg 05/02/2013   Varicose veins of bilateral lower extremities with other complications 06/10/2013   BMI 34.0-34.9,adult 07/26/2013   DVT (deep venous thrombosis) (HCC) 10/24/2013   Excessive hair growth 01/12/2014   S/P laparoscopic cholecystectomy 02/19/2014   Abnormal uterine bleeding (AUB) 02/24/2015   Vitamin D deficiency 10/08/2015   Anxiety and depression 11/26/2016   Class 2 obesity due to excess calories without serious comorbidity with body mass index (BMI) of 37.0 to 37.9 in adult 11/26/2016   Medication side effect 04/10/2017   Suspicious nevus 10/06/2017   No energy 10/09/2017   Non-restorative sleep 10/09/2017   Snoring 10/09/2017   Apnea 10/09/2017   Left-sided chest pain 10/09/2017   Dyslipidemia (high LDL; low HDL) 10/10/2017   Elevated liver enzymes 10/10/2017   Mild obstructive sleep apnea 11/25/2017   Intertrigo 02/11/2020    Hemorrhoids 10/24/2020   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    Objective:     BP 123/67   Pulse 93   Ht 5\' 9"  (1.753 m)   Wt 262 lb (118.8 kg)   SpO2 99%   BMI 38.69 kg/m  BP Readings from Last 3 Encounters:  05/03/23 123/67  01/25/23 126/69  10/19/22 121/67   Wt Readings from Last 3 Encounters:  05/03/23 262 lb (118.8 kg)  01/25/23 256 lb (116.1 kg)  10/19/22 241 lb 8 oz (109.5 kg)    ..    05/03/2023    9:09 AM 01/25/2023    7:44 AM 04/20/2022    1:55 PM 04/28/2021    7:39 AM 10/23/2020    3:39 PM  Depression screen PHQ 2/9  Decreased Interest 0 2 0 0 0  Down, Depressed, Hopeless 0 1 0 0 0  PHQ - 2 Score 0 3 0 0 0  Altered sleeping 0 0 1  3  Tired,  decreased energy 1 2 1  2   Change in appetite 0 1 1  2   Feeling bad or failure about yourself  0 0 0  0  Trouble concentrating 0 0 0  0  Moving slowly or fidgety/restless 0 0 0  0  Suicidal thoughts 0 0 0  0  PHQ-9 Score 1 6 3  7   Difficult doing work/chores Not difficult at all Not difficult at all Not difficult at all  Not difficult at all   ..    05/03/2023    9:09 AM 01/25/2023    7:46 AM 04/20/2022    1:57 PM 10/23/2020    3:40 PM  GAD 7 : Generalized Anxiety Score  Nervous, Anxious, on Edge 1 2 1 2   Control/stop worrying 0 2 0 0  Worry too much - different things 0 2 0 0  Trouble relaxing 0 3 1 0  Restless 0 3 0 0  Easily annoyed or irritable 1 3 0 0  Afraid - awful might happen 0 2 0 0  Total GAD 7 Score 2 17 2 2   Anxiety Difficulty Not difficult at all Not difficult at all  Not difficult at all Not difficult at all     Physical Exam Constitutional:      Appearance: Normal appearance. She is obese.  HENT:     Head: Normocephalic.  Cardiovascular:     Rate and Rhythm: Normal rate.  Pulmonary:     Effort: Pulmonary effort is normal.  Neurological:     Mental Status: She is alert and oriented to person, place, and time.  Psychiatric:        Mood and Affect: Mood normal.        The 10-year ASCVD risk score (Arnett DK, et al., 2019) is: 0.6%    Assessment & Plan:  Marland KitchenMarland KitchenJalayna was seen today for medical management of chronic issues.  Diagnoses and all orders for this visit:  Class 2 obesity due to excess calories without serious comorbidity with body mass index (BMI) of 38.0 to 38.9 in adult -     tirzepatide (ZEPBOUND) 10 MG/0.5ML Pen; Inject 10 mg into the skin once a week.  Anxiety and depression -     venlafaxine XR (EFFEXOR XR) 75 MG 24 hr capsule; Take 1 capsule (75 mg total) by mouth daily with breakfast.  PCOS (polycystic ovarian syndrome) -     tirzepatide (ZEPBOUND) 10 MG/0.5ML Pen; Inject 10 mg into the skin once a week.  Dyslipidemia (high LDL; low HDL) -     tirzepatide (ZEPBOUND) 10 MG/0.5ML Pen; Inject 10 mg into the skin once a week.   Vitals looks great Increased zepbound to 10mg  Continue to work on increasing protein, regular exercise, decreasing carbs and sugars.  PHQ/GAD to goal Refilled effexor Labs to be done Friday with genetic screening through [facility]   Return in about 6 months (around 10/31/2023).    Tandy Gaw, PA-C

## 2023-05-05 ENCOUNTER — Encounter: Payer: Self-pay | Admitting: Physician Assistant

## 2023-05-05 ENCOUNTER — Other Ambulatory Visit (HOSPITAL_COMMUNITY)
Admission: RE | Admit: 2023-05-05 | Discharge: 2023-05-05 | Disposition: A | Discharge status: Home / Self Care | Payer: Self-pay | Source: Ambulatory Visit | Attending: Oncology | Admitting: Oncology

## 2023-05-10 ENCOUNTER — Encounter (INDEPENDENT_AMBULATORY_CARE_PROVIDER_SITE_OTHER): Payer: Self-pay

## 2023-05-16 LAB — GENECONNECT MOLECULAR SCREEN: Genetic Analysis Overall Interpretation: NEGATIVE

## 2023-05-17 ENCOUNTER — Encounter: Payer: Self-pay | Admitting: Physician Assistant

## 2023-05-17 DIAGNOSIS — F32A Depression, unspecified: Secondary | ICD-10-CM

## 2023-05-17 DIAGNOSIS — E282 Polycystic ovarian syndrome: Secondary | ICD-10-CM

## 2023-05-17 DIAGNOSIS — R635 Abnormal weight gain: Secondary | ICD-10-CM

## 2023-05-17 DIAGNOSIS — E559 Vitamin D deficiency, unspecified: Secondary | ICD-10-CM

## 2023-05-17 DIAGNOSIS — L689 Hypertrichosis, unspecified: Secondary | ICD-10-CM

## 2023-05-17 DIAGNOSIS — E785 Hyperlipidemia, unspecified: Secondary | ICD-10-CM

## 2023-05-17 DIAGNOSIS — F419 Anxiety disorder, unspecified: Secondary | ICD-10-CM

## 2023-05-31 ENCOUNTER — Other Ambulatory Visit: Payer: Self-pay | Admitting: Physician Assistant

## 2023-05-31 DIAGNOSIS — E282 Polycystic ovarian syndrome: Secondary | ICD-10-CM

## 2023-05-31 DIAGNOSIS — E66812 Obesity, class 2: Secondary | ICD-10-CM

## 2023-05-31 DIAGNOSIS — E785 Hyperlipidemia, unspecified: Secondary | ICD-10-CM

## 2023-06-05 ENCOUNTER — Other Ambulatory Visit: Payer: Self-pay | Admitting: Physician Assistant

## 2023-06-05 DIAGNOSIS — E785 Hyperlipidemia, unspecified: Secondary | ICD-10-CM

## 2023-06-05 DIAGNOSIS — E6609 Other obesity due to excess calories: Secondary | ICD-10-CM

## 2023-06-05 DIAGNOSIS — E282 Polycystic ovarian syndrome: Secondary | ICD-10-CM

## 2023-06-06 ENCOUNTER — Encounter: Payer: Self-pay | Admitting: Physician Assistant

## 2023-06-06 DIAGNOSIS — E282 Polycystic ovarian syndrome: Secondary | ICD-10-CM

## 2023-06-06 DIAGNOSIS — E66812 Obesity, class 2: Secondary | ICD-10-CM

## 2023-06-06 DIAGNOSIS — E785 Hyperlipidemia, unspecified: Secondary | ICD-10-CM

## 2023-06-07 MED ORDER — ZEPBOUND 10 MG/0.5ML ~~LOC~~ SOAJ: 10.0000 mg | SUBCUTANEOUS | 0 refills | Status: DC

## 2023-06-07 NOTE — Telephone Encounter (Signed)
 Refill for zepbound already sent today.

## 2023-06-08 LAB — TSH+FREE T4
Free T4: 1.1 ng/dL (ref 0.82–1.77)
TSH: 1.49 u[IU]/mL (ref 0.450–4.500)

## 2023-06-08 LAB — CBC WITH DIFFERENTIAL/PLATELET
Basophils Absolute: 0.1 10*3/uL (ref 0.0–0.2)
Basos: 1 %
EOS (ABSOLUTE): 0 10*3/uL (ref 0.0–0.4)
Eos: 0 %
Hematocrit: 42.2 % (ref 34.0–46.6)
Hemoglobin: 14 g/dL (ref 11.1–15.9)
Immature Grans (Abs): 0 10*3/uL (ref 0.0–0.1)
Immature Granulocytes: 0 %
Lymphocytes Absolute: 1.9 10*3/uL (ref 0.7–3.1)
Lymphs: 27 %
MCH: 30.4 pg (ref 26.6–33.0)
MCHC: 33.2 g/dL (ref 31.5–35.7)
MCV: 92 fL (ref 79–97)
Monocytes Absolute: 0.5 10*3/uL (ref 0.1–0.9)
Monocytes: 7 %
Neutrophils Absolute: 4.6 10*3/uL (ref 1.4–7.0)
Neutrophils: 65 %
Platelets: 292 10*3/uL (ref 150–450)
RBC: 4.6 x10E6/uL (ref 3.77–5.28)
RDW: 13.1 % (ref 11.7–15.4)
WBC: 7.1 10*3/uL (ref 3.4–10.8)

## 2023-06-08 LAB — IRON,TIBC AND FERRITIN PANEL
Ferritin: 90 ng/mL (ref 15–150)
Iron Saturation: 17 % (ref 15–55)
Iron: 46 ug/dL (ref 27–159)
Total Iron Binding Capacity: 268 ug/dL (ref 250–450)
UIBC: 222 ug/dL (ref 131–425)

## 2023-06-08 LAB — TESTOSTERONE: Testosterone: 9 ng/dL (ref 4–50)

## 2023-06-08 LAB — LIPID PANEL
Chol/HDL Ratio: 3.8 {ratio} (ref 0.0–4.4)
Cholesterol, Total: 155 mg/dL (ref 100–199)
HDL: 41 mg/dL (ref >39)
LDL Chol Calc (NIH): 101 mg/dL — ABNORMAL HIGH (ref 0–99)
Triglycerides: 66 mg/dL (ref 0–149)
VLDL Cholesterol Cal: 13 mg/dL (ref 5–40)

## 2023-06-08 LAB — CMP14+EGFR
ALT: 15 [IU]/L (ref 0–32)
AST: 16 [IU]/L (ref 0–40)
Albumin: 3.7 g/dL — ABNORMAL LOW (ref 3.9–4.9)
Alkaline Phosphatase: 114 [IU]/L (ref 44–121)
BUN/Creatinine Ratio: 15 (ref 9–23)
BUN: 11 mg/dL (ref 6–24)
Bilirubin Total: 0.4 mg/dL (ref 0.0–1.2)
CO2: 24 mmol/L (ref 20–29)
Calcium: 9 mg/dL (ref 8.7–10.2)
Chloride: 104 mmol/L (ref 96–106)
Creatinine, Ser: 0.72 mg/dL (ref 0.57–1.00)
Globulin, Total: 2.7 g/dL (ref 1.5–4.5)
Glucose: 82 mg/dL (ref 70–99)
Potassium: 4.6 mmol/L (ref 3.5–5.2)
Sodium: 138 mmol/L (ref 134–144)
Total Protein: 6.4 g/dL (ref 6.0–8.5)
eGFR: 106 mL/min/{1.73_m2} (ref >59)

## 2023-06-08 LAB — B12 AND FOLATE PANEL
Folate: 12 ng/mL (ref >3.0)
Vitamin B-12: 601 pg/mL (ref 232–1245)

## 2023-06-08 LAB — FSH/LH
FSH: 6 m[IU]/mL
LH: 2.7 m[IU]/mL

## 2023-06-08 LAB — ESTRADIOL: Estradiol: 37.9 pg/mL

## 2023-06-08 LAB — VITAMIN D 25 HYDROXY (VIT D DEFICIENCY, FRACTURES): Vit D, 25-Hydroxy: 24.2 ng/mL — ABNORMAL LOW (ref 30.0–100.0)

## 2023-06-08 LAB — CORTISOL: Cortisol: 7.4 ug/dL (ref 6.2–19.4)

## 2023-06-08 LAB — PROGESTERONE: Progesterone: 0.3 ng/mL

## 2023-06-08 LAB — ACTH: ACTH: 12.6 pg/mL (ref 7.2–63.3)

## 2023-06-09 MED ORDER — ZEPBOUND 12.5 MG/0.5ML ~~LOC~~ SOAJ: 12.5000 mg | SUBCUTANEOUS | 0 refills | Status: DC

## 2023-06-09 NOTE — Addendum Note (Signed)
 Addended by: Jomarie Longs on: 06/09/2023 04:03 PM   Modules accepted: Orders

## 2023-06-12 ENCOUNTER — Encounter: Payer: Self-pay | Admitting: Physician Assistant

## 2023-06-12 NOTE — Progress Notes (Signed)
 Stanley,   Albumin a little low. You need more protein in diet.   Kidney, liver, glucose look great!  Iron stores look good.   B12 looks great!   Thyroid looks great.     HDL, good cholesterol, looks better.  LDL, bad cholesterol, looks good.   LOW risk for CV event in next 10 years.   [name].The 10-year ASCVD risk score (Arnett DK, et al., 2019) is: 0.7%   Values used to calculate the score:     Age: 43 years     Sex: Female     Is Non-Hispanic African American: No     Diabetic: No     Tobacco smoker: No     Systolic Blood Pressure: 123 mmHg     Is BP treated: No     HDL Cholesterol: 41 mg/dL     Total Cholesterol: 155 mg/dL   Vitamin D low. Increase by 2000 units daily and take with diary for better absorption.   Cortisol and ACTH normal. This is your adrenal glands.   Not in menopause but estrogen and progesterone levels are lower which means you are peri menopausal.

## 2023-06-14 ENCOUNTER — Encounter: Payer: Self-pay | Admitting: Physician Assistant

## 2023-06-15 LAB — CORTISOL, URINE, FREE
Cortisol (Ur), Free: 20 ug/(24.h) (ref 6–42)
Cortisol,F,ug/L,U: 8 ug/L

## 2023-07-03 ENCOUNTER — Other Ambulatory Visit: Payer: Self-pay | Admitting: Physician Assistant

## 2023-07-04 ENCOUNTER — Other Ambulatory Visit (HOSPITAL_COMMUNITY): Payer: Self-pay

## 2023-07-06 MED ORDER — ZEPBOUND 12.5 MG/0.5ML ~~LOC~~ SOAJ: 12.5000 mg | SUBCUTANEOUS | 0 refills | Status: DC

## 2023-08-02 ENCOUNTER — Other Ambulatory Visit: Payer: Self-pay | Admitting: Physician Assistant

## 2023-08-02 ENCOUNTER — Telehealth: Payer: Self-pay

## 2023-08-02 ENCOUNTER — Other Ambulatory Visit (HOSPITAL_COMMUNITY): Payer: Self-pay

## 2023-08-02 MED ORDER — ZEPBOUND 12.5 MG/0.5ML ~~LOC~~ SOAJ: 12.5000 mg | SUBCUTANEOUS | 0 refills | Status: DC

## 2023-08-02 NOTE — Telephone Encounter (Signed)
 Pharmacy Patient Advocate Encounter   Received notification from Patient Pharmacy that prior authorization for Zepbound  is required/requested.   Insurance verification completed.   The patient is insured through  [employer]  .   Per test claim: PA required; PA submitted to above mentioned insurance via CoverMyMeds Key/confirmation #/EOC [id number] Status is pending

## 2023-08-02 NOTE — Telephone Encounter (Signed)
 Pharmacy Patient Advocate Encounter  Received notification from  [facility]  that Prior Authorization for Zepbound  has been APPROVED from 08/02/23 to 01/29/24. Ran test claim, Copay is $24.99 for a 28 day supply. This test claim was processed through [facility]- copay amounts may vary at other pharmacies due to pharmacy/plan contracts, or as the patient moves through the different stages of their insurance plan.   PA #/Case ID/Reference #: [id number]

## 2023-09-11 ENCOUNTER — Telehealth (INDEPENDENT_AMBULATORY_CARE_PROVIDER_SITE_OTHER): Payer: PRIVATE HEALTH INSURANCE | Admitting: Physician Assistant

## 2023-09-11 ENCOUNTER — Encounter: Payer: Self-pay | Admitting: Physician Assistant

## 2023-09-11 VITALS — Ht 69.0 in | Wt 256.0 lb

## 2023-09-11 DIAGNOSIS — Z79899 Other long term (current) drug therapy: Secondary | ICD-10-CM

## 2023-09-11 DIAGNOSIS — E66812 Obesity, class 2: Secondary | ICD-10-CM

## 2023-09-11 DIAGNOSIS — N951 Menopausal and female climacteric states: Secondary | ICD-10-CM

## 2023-09-11 DIAGNOSIS — E6609 Other obesity due to excess calories: Secondary | ICD-10-CM | POA: Diagnosis not present

## 2023-09-11 DIAGNOSIS — E282 Polycystic ovarian syndrome: Secondary | ICD-10-CM

## 2023-09-11 DIAGNOSIS — Z5181 Encounter for therapeutic drug level monitoring: Secondary | ICD-10-CM

## 2023-09-11 DIAGNOSIS — E785 Hyperlipidemia, unspecified: Secondary | ICD-10-CM

## 2023-09-11 DIAGNOSIS — Z6837 Body mass index (BMI) 37.0-37.9, adult: Secondary | ICD-10-CM

## 2023-09-11 MED ORDER — VEOZAH 45 MG PO TABS: 1.0000 | ORAL_TABLET | Freq: Every day | ORAL | 5 refills | Status: DC

## 2023-09-11 MED ORDER — ZEPBOUND 12.5 MG/0.5ML ~~LOC~~ SOAJ: 12.5000 mg | SUBCUTANEOUS | 1 refills | Status: DC

## 2023-09-11 MED ORDER — ATORVASTATIN CALCIUM 40 MG PO TABS: 40.0000 mg | ORAL_TABLET | Freq: Every day | ORAL | 3 refills | Status: AC

## 2023-09-12 NOTE — Progress Notes (Signed)
..  Virtual Visit via Video Note  I connected with Melanie Perez on 09/11/2023 at  4:20 PM EDT by a video enabled telemedicine application and verified that I am speaking with the correct person using two identifiers.  Location: Patient: work Provider: clinic  .Aaron AasParticipating in visit:  Patient:Melanie Perez  Provider: Sandy Crumb PA-C   I discussed the limitations of evaluation and management by telemedicine and the availability of in person appointments. The patient expressed understanding and agreed to proceed.  History of Present Illness: Pt is a 43 yo obese female who calls into the clinic for medication refills.   Pt is taking zepbound  for weight loss. She has lost another 7lbs since last visit. She denies any side effects. She admits she is not exercising like she should. She is eating small quantities but not always making good choices.   She is having hot flashes and not a candidate for HRT. Hot flashes are severe and just "not fun". She is interested in trying something.     Observations/Objective: No acute distress Normal mood and appearance Normal breathing  .Aaron Aas Today's Vitals   09/11/23 1610  Weight: 256 lb (116.1 kg)  Height: 5\' 9"  (1.753 m)   Body mass index is 37.8 kg/m.    Assessment and Plan: Aaron AasAaron AasMorissa was seen today for medical management of chronic issues.  Diagnoses and all orders for this visit:  Vasomotor symptoms due to menopause -     Fezolinetant (VEOZAH) 45 MG TABS; Take 1 tablet (45 mg total) by mouth daily. -     CMP14+EGFR  Dyslipidemia (high LDL; low HDL) -     atorvastatin  (LIPITOR) 40 MG tablet; Take 1 tablet (40 mg total) by mouth daily.  Medication management -     CMP14+EGFR  PCOS (polycystic ovarian syndrome) -     tirzepatide  (ZEPBOUND ) 12.5 MG/0.5ML Pen; Inject 12.5 mg into the skin once a week.  Class 2 obesity due to excess calories without serious comorbidity with body mass index (BMI) of 37.0 to 37.9 in adult -     tirzepatide   (ZEPBOUND ) 12.5 MG/0.5ML Pen; Inject 12.5 mg into the skin once a week.   Continues to lose weight, down 7lbs.  Refilled zepbound  and follow up in 6 months Continue to work on daily exercise and 80g of protein.   Start veozah for hot flashes Recheck liver enzymes in 1,2,3 months  Refilled lipitor, UTD labs.    Follow Up Instructions:    I discussed the assessment and treatment plan with the patient. The patient was provided an opportunity to ask questions and all were answered. The patient agreed with the plan and demonstrated an understanding of the instructions.   The patient was advised to call back or seek an in-person evaluation if the symptoms worsen or if the condition fails to improve as anticipated.    Darnette Lampron, PA-C

## 2023-09-13 ENCOUNTER — Telehealth: Payer: Self-pay | Admitting: *Deleted

## 2023-09-13 NOTE — Telephone Encounter (Signed)
-----   Message from Grandy sent at 09/12/2023 12:26 PM EDT ----- I forgot to mention on veozah need to check liver enzymes(lab only) at 1, 2, 3 months. I printed lab for first month.

## 2023-09-13 NOTE — Telephone Encounter (Signed)
 Called pt and lvm advising her of Jade's recommendations. And that she can come next month to have this done. No appointment needed

## 2023-09-19 ENCOUNTER — Telehealth: Payer: Self-pay

## 2023-09-19 NOTE — Telephone Encounter (Signed)
 Pharmacy Patient Advocate Encounter   Received notification from CoverMyMeds that prior authorization for Veozah  45mg  tabs is required/requested.   Insurance verification completed.   The patient is insured through [employer] .   Per test claim: PA required; PA submitted to above mentioned insurance via CoverMyMeds Key/confirmation #/EOC [id number] Status is pending

## 2023-09-22 ENCOUNTER — Other Ambulatory Visit (HOSPITAL_COMMUNITY): Payer: Self-pay

## 2023-09-22 NOTE — Telephone Encounter (Signed)
 Pharmacy Patient Advocate Encounter  Received notification from [facility] teammate Plan that Prior Authorization for Veozah  45mg  tabs has been APPROVED from 09/19/23 to 03/17/24. Unable to obtain price due to refill too soon rejection, last fill date 09/22/23 next available fill date7/6/25   PA #/Case ID/Reference #: [id number]

## 2023-11-01 ENCOUNTER — Ambulatory Visit: Payer: PRIVATE HEALTH INSURANCE | Admitting: Physician Assistant

## 2023-11-23 ENCOUNTER — Encounter: Payer: Self-pay | Admitting: Physician Assistant

## 2024-01-04 LAB — HM MAMMOGRAPHY

## 2024-01-17 ENCOUNTER — Telehealth: Payer: Self-pay

## 2024-01-17 ENCOUNTER — Other Ambulatory Visit (HOSPITAL_COMMUNITY): Payer: Self-pay

## 2024-01-17 NOTE — Telephone Encounter (Signed)
 Pharmacy Patient Advocate Encounter   Received notification from CoverMyMeds that prior authorization for Zepbound  12.5mg /0.33ml is due for renewal.   Insurance verification completed.   The patient is insured through [employer].  Patient hasn't been seen in your office since 04/2023. Plan requires updated chart notes for PA renewal.  Key: [id number]

## 2024-01-19 NOTE — Telephone Encounter (Signed)
 If not requiring a OV I am ok with nurse visit.

## 2024-01-19 NOTE — Telephone Encounter (Signed)
 Insurance needs appt for zepbound  renewal. Please make so we can submit to insurance. thanks

## 2024-01-19 NOTE — Telephone Encounter (Signed)
 Good afternoon, the insurance company require a current weight and BMI chart documented update (within the last 45 days) Thanks!

## 2024-01-23 ENCOUNTER — Encounter: Payer: Self-pay | Admitting: Physician Assistant

## 2024-01-24 ENCOUNTER — Other Ambulatory Visit (HOSPITAL_COMMUNITY): Payer: Self-pay

## 2024-01-24 ENCOUNTER — Other Ambulatory Visit: Payer: Self-pay | Admitting: Physician Assistant

## 2024-01-24 ENCOUNTER — Ambulatory Visit (INDEPENDENT_AMBULATORY_CARE_PROVIDER_SITE_OTHER): Payer: PRIVATE HEALTH INSURANCE

## 2024-01-24 VITALS — Ht 69.0 in | Wt 255.8 lb

## 2024-01-24 DIAGNOSIS — Z6834 Body mass index (BMI) 34.0-34.9, adult: Secondary | ICD-10-CM

## 2024-01-24 DIAGNOSIS — R635 Abnormal weight gain: Secondary | ICD-10-CM

## 2024-01-24 MED ORDER — SCOPOLAMINE 1 MG/3DAYS TD PT72: 1.0000 | MEDICATED_PATCH | TRANSDERMAL | 0 refills | Status: DC

## 2024-01-24 NOTE — Progress Notes (Signed)
 Patient is in office today for a weight check. Insurance company is requiring a current weight and BMI update in the chart (within the last 45 days) for her zepbound  authorization  Pt weighs 255.12  today in office. This has been documented in pts chart. Pt also inquiring about motion the sickness patches (scopolamine ) that she has been prescribed previously. Request forwarded to Jade Breeback, PA-C

## 2024-01-24 NOTE — Telephone Encounter (Signed)
 Pharmacy Patient Advocate Encounter  Received notification from [facility] that Prior Authorization for Zepbound  12.5mg /0.5ml has been CANCELLED due to refill too soon. Next refill is available on or after 01/29/24   PA #/Case ID/Reference #: [id number]

## 2024-03-04 ENCOUNTER — Other Ambulatory Visit: Payer: Self-pay | Admitting: Physician Assistant

## 2024-03-04 DIAGNOSIS — B009 Herpesviral infection, unspecified: Secondary | ICD-10-CM

## 2024-03-25 ENCOUNTER — Encounter: Payer: Self-pay | Admitting: Physician Assistant

## 2024-03-25 ENCOUNTER — Ambulatory Visit: Payer: PRIVATE HEALTH INSURANCE | Admitting: Physician Assistant

## 2024-03-25 DIAGNOSIS — E785 Hyperlipidemia, unspecified: Secondary | ICD-10-CM

## 2024-03-25 DIAGNOSIS — F3342 Major depressive disorder, recurrent, in full remission: Secondary | ICD-10-CM

## 2024-03-25 DIAGNOSIS — Z79899 Other long term (current) drug therapy: Secondary | ICD-10-CM | POA: Diagnosis not present

## 2024-03-25 DIAGNOSIS — E282 Polycystic ovarian syndrome: Secondary | ICD-10-CM | POA: Diagnosis not present

## 2024-03-25 DIAGNOSIS — F419 Anxiety disorder, unspecified: Secondary | ICD-10-CM

## 2024-03-25 DIAGNOSIS — Z6834 Body mass index (BMI) 34.0-34.9, adult: Secondary | ICD-10-CM

## 2024-03-25 DIAGNOSIS — E559 Vitamin D deficiency, unspecified: Secondary | ICD-10-CM

## 2024-03-25 DIAGNOSIS — G8929 Other chronic pain: Secondary | ICD-10-CM | POA: Diagnosis not present

## 2024-03-25 DIAGNOSIS — M545 Low back pain, unspecified: Secondary | ICD-10-CM

## 2024-03-25 MED ORDER — ZEPBOUND 12.5 MG/0.5ML ~~LOC~~ SOAJ: 12.5000 mg | SUBCUTANEOUS | 1 refills | Status: AC

## 2024-03-25 MED ORDER — VENLAFAXINE HCL ER 75 MG PO CP24: 75.0000 mg | ORAL_CAPSULE | Freq: Every day | ORAL | 3 refills | Status: AC

## 2024-03-25 NOTE — Patient Instructions (Signed)
 Consider tens unit and stretches for low back. Heating pad as needed.   Low Back Sprain or Strain Rehab Ask your health care provider which exercises are safe for you. Do exercises exactly as told by your health care provider and adjust them as directed. It is normal to feel mild stretching, pulling, tightness, or discomfort as you do these exercises. Stop right away if you feel sudden pain or your pain gets worse. Do not begin these exercises until told by your health care provider. Stretching and range-of-motion exercises These exercises warm up your muscles and joints and improve the movement and flexibility of your back. These exercises also help to relieve pain, numbness, and tingling. Lumbar rotation  Lie on your back on a firm bed or the floor with your knees bent. Straighten your arms out to your sides so each arm forms a 90-degree angle (right angle) with a side of your body. Slowly move (rotate) both of your knees to one side of your body until you feel a stretch in your lower back (lumbar). Try not to let your shoulders lift off the floor. Hold this position for __________ seconds. Tense your abdominal muscles and slowly move your knees back to the starting position. Repeat this exercise on the other side of your body. Repeat __________ times. Complete this exercise __________ times a day. Single knee to chest  Lie on your back on a firm bed or the floor with both legs straight. Bend one of your knees. Use your hands to move your knee up toward your chest until you feel a gentle stretch in your lower back and buttock. Hold your leg in this position by holding on to the front of your knee. Keep your other leg as straight as possible. Hold this position for __________ seconds. Slowly return to the starting position. Repeat with your other leg. Repeat __________ times. Complete this exercise __________ times a day. Prone extension on elbows  Lie on your abdomen on a firm bed or the  floor (prone position). Prop yourself up on your elbows. Use your arms to help lift your chest up until you feel a gentle stretch in your abdomen and your lower back. This will place some of your body weight on your elbows. If this is uncomfortable, try stacking pillows under your chest. Your hips should stay down, against the surface that you are lying on. Keep your hip and back muscles relaxed. Hold this position for __________ seconds. Slowly relax your upper body and return to the starting position. Repeat __________ times. Complete this exercise __________ times a day. Strengthening exercises These exercises build strength and endurance in your back. Endurance is the ability to use your muscles for a long time, even after they get tired. Pelvic tilt This exercise strengthens the muscles that lie deep in the abdomen. Lie on your back on a firm bed or the floor with your legs extended. Bend your knees so they are pointing toward the ceiling and your feet are flat on the floor. Tighten your lower abdominal muscles to press your lower back against the floor. This motion will tilt your pelvis so your tailbone points up toward the ceiling instead of pointing to your feet or the floor. To help with this exercise, you may place a small towel under your lower back and try to push your back into the towel. Hold this position for __________ seconds. Let your muscles relax completely before you repeat this exercise. Repeat __________ times. Complete this exercise  __________ times a day. Alternating arm and leg raises  Get on your hands and knees on a firm surface. If you are on a hard floor, you may want to use padding, such as an exercise mat, to cushion your knees. Line up your arms and legs. Your hands should be directly below your shoulders, and your knees should be directly below your hips. Lift your left leg behind you. At the same time, raise your right arm and straighten it in front of  you. Do not lift your leg higher than your hip. Do not lift your arm higher than your shoulder. Keep your abdominal and back muscles tight. Keep your hips facing the ground. Do not arch your back. Keep your balance carefully, and do not hold your breath. Hold this position for __________ seconds. Slowly return to the starting position. Repeat with your right leg and your left arm. Repeat __________ times. Complete this exercise __________ times a day. Abdominal set with straight leg raise  Lie on your back on a firm bed or the floor. Bend one of your knees and keep your other leg straight. Tense your abdominal muscles and lift your straight leg up, 4-6 inches (10-15 cm) off the ground. Keep your abdominal muscles tight and hold this position for __________ seconds. Do not hold your breath. Do not arch your back. Keep it flat against the ground. Keep your abdominal muscles tense as you slowly lower your leg back to the starting position. Repeat with your other leg. Repeat __________ times. Complete this exercise __________ times a day. Single leg lower with bent knees Lie on your back on a firm bed or the floor. Tense your abdominal muscles and lift your feet off the floor, one foot at a time, so your knees and hips are bent in 90-degree angles (right angles). Your knees should be over your hips and your lower legs should be parallel to the floor. Keeping your abdominal muscles tense and your knee bent, slowly lower one of your legs so your toe touches the ground. Lift your leg back up to return to the starting position. Do not hold your breath. Do not let your back arch. Keep your back flat against the ground. Repeat with your other leg. Repeat __________ times. Complete this exercise __________ times a day. Posture and body mechanics Good posture and healthy body mechanics can help to relieve stress in your body's tissues and joints. Body mechanics refers to the movements and  positions of your body while you do your daily activities. Posture is part of body mechanics. Good posture means: Your spine is in its natural S-curve position (neutral). Your shoulders are pulled back slightly. Your head is not tipped forward (neutral). Follow these guidelines to improve your posture and body mechanics in your everyday activities. Standing  When standing, keep your spine neutral and your feet about hip-width apart. Keep a slight bend in your knees. Your ears, shoulders, and hips should line up. When you do a task in which you stand in one place for a long time, place one foot up on a stable object that is 2-4 inches (5-10 cm) high, such as a footstool. This helps keep your spine neutral. Sitting  When sitting, keep your spine neutral and keep your feet flat on the floor. Use a footrest, if necessary, and keep your thighs parallel to the floor. Avoid rounding your shoulders, and avoid tilting your head forward. When working at a desk or a animator, keep your desk  at a height where your hands are slightly lower than your elbows. Slide your chair under your desk so you are close enough to maintain good posture. When working at a computer, place your monitor at a height where you are looking straight ahead and you do not have to tilt your head forward or downward to look at the screen. Resting When lying down and resting, avoid positions that are most painful for you. If you have pain with activities such as sitting, bending, stooping, or squatting, lie in a position in which your body does not bend very much. For example, avoid curling up on your side with your arms and knees near your chest (fetal position). If you have pain with activities such as standing for a long time or reaching with your arms, lie with your spine in a neutral position and bend your knees slightly. Try the following positions: Lying on your side with a pillow between your knees. Lying on your back with a  pillow under your knees. Lifting  When lifting objects, keep your feet at least shoulder-width apart and tighten your abdominal muscles. Bend your knees and hips and keep your spine neutral. It is important to lift using the strength of your legs, not your back. Do not lock your knees straight out. Always ask for help to lift heavy or awkward objects. This information is not intended to replace advice given to you by your health care provider. Make sure you discuss any questions you have with your health care provider. Document Revised: 08/01/2022 Document Reviewed: 06/15/2020 Elsevier Patient Education  2024 [employer].

## 2024-03-25 NOTE — Progress Notes (Unsigned)
 Established Patient Office Visit  Subjective   Patient ID: Melanie Perez, female    DOB: Nov 20, 1980  Age: 43 y.o. MRN: [id number]  Chief Complaint  Patient presents with   Medical Management of Chronic Issues    HPI .SABRADiscussed the use of AI scribe software for clinical note transcription with the patient, who gave verbal consent to proceed.  History of Present Illness Melanie Perez is a 43 year old female who presents for follow-up on weight loss and vasomotor symptoms.  Weight loss - Lost four pounds since last visit. - Taking zepbound  once a week at 12.5mg  - No side effects except for managable constipation - Walking but not exercising like she should  Sleep disturbance - Started magnesium 400 mg nightly around June or July. - Magnesium has improved sleep quality, allowing her to fall asleep and stay asleep more effectively.  Right-sided nocturnal low back pain - Experiences right-sided pain, initially suspected to be a kidney stone. - Pain occurs mostly at night, about three to four times a week. - Pain is relieved by standing and walking. - Pain is more on the side, making it difficult to find a comfortable position when lying down. - No urinary symptoms present. - X-ray at urgent care did not reveal any kidney stones.  Vasomotor symptoms - Not currently taking Veozah  due to cost and lack of persistent symptoms.   ROS See HPI.    Objective:     BP 118/74   Pulse 86   Ht 5' 9 (1.753 m)   Wt 251 lb (113.9 kg)   SpO2 99%   BMI 37.07 kg/m  BP Readings from Last 3 Encounters:  03/25/24 118/74  05/03/23 123/67  01/25/23 126/69   Wt Readings from Last 3 Encounters:  03/25/24 251 lb (113.9 kg)  01/24/24 255 lb 12 oz (116 kg)  09/11/23 256 lb (116.1 kg)    ..    03/25/2024    9:00 AM 05/03/2023    9:09 AM 01/25/2023    7:44 AM 04/20/2022    1:55 PM 04/28/2021    7:39 AM  Depression screen PHQ 2/9  Decreased Interest 0 0 2 0 0  Down, Depressed,  Hopeless 0 0 1 0 0  PHQ - 2 Score 0 0 3 0 0  Altered sleeping 0 0 0 1   Tired, decreased energy 1 1 2 1    Change in appetite 0 0 1 1   Feeling bad or failure about yourself  0 0 0 0   Trouble concentrating 0 0 0 0   Moving slowly or fidgety/restless 0 0 0 0   Suicidal thoughts 0 0 0 0   PHQ-9 Score 1 1  6  3     Difficult doing work/chores Not difficult at all Not difficult at all Not difficult at all Not difficult at all      Data saved with a previous flowsheet row definition       Physical Exam Constitutional:      Appearance: Normal appearance. She is obese.  HENT:     Head: Normocephalic.  Cardiovascular:     Rate and Rhythm: Normal rate and regular rhythm.  Pulmonary:     Effort: Pulmonary effort is normal.     Breath sounds: Normal breath sounds.  Musculoskeletal:     Cervical back: Normal range of motion and neck supple. No tenderness.     Right lower leg: No edema.     Left lower leg: No edema.  Lymphadenopathy:     Cervical: No cervical adenopathy.  Neurological:     General: No focal deficit present.     Mental Status: She is alert and oriented to person, place, and time.  Psychiatric:        Mood and Affect: Mood normal.      The 10-year ASCVD risk score (Arnett DK, et al., 2019) is: 0.7%    Assessment & Plan:  SABRASABRADorice was seen today for medical management of chronic issues.  Diagnoses and all orders for this visit:  Morbid obesity (HCC) -     tirzepatide  (ZEPBOUND ) 12.5 MG/0.5ML Pen; Inject 12.5 mg into the skin once a week.  Dyslipidemia (high LDL; low HDL) -     tirzepatide  (ZEPBOUND ) 12.5 MG/0.5ML Pen; Inject 12.5 mg into the skin once a week.  Medication management  Vitamin D  deficiency -     VITAMIN D  25 Hydroxy (Vit-D Deficiency, Fractures)  Chronic right-sided low back pain without sciatica -     CMP14+EGFR -     CBC with Differential/Platelet  PCOS (polycystic ovarian syndrome) -     tirzepatide  (ZEPBOUND ) 12.5 MG/0.5ML Pen;  Inject 12.5 mg into the skin once a week.  MDD (major depressive disorder), recurrent, in full remission -     venlafaxine  XR (EFFEXOR  XR) 75 MG 24 hr capsule; Take 1 capsule (75 mg total) by mouth daily with breakfast.    Assessment & Plan Morbid obesity Lost 4lbs over last 6 months.  Continued weight loss with Tirzepatide  12.5 mg weekly. Exercise limited by weather, but walking maintained. Encouraged to increase exercise over next few months.  - Continue Tirzepatide  12.5 mg weekly. - Encouraged 60g of protein daily.   Menopausal vasomotor symptoms Symptoms decreased; Veozah  discontinued due to cost and lack of symptoms.  MDD, in remission PHQ to goal - doing well no concerns on effexor   Right-sided musculoskeletal pain Intermittent nocturnal pain, likely musculoskeletal. Differential includes musculoskeletal versus kidney-related issues. Relieved by movement and stretching. - Ordered CMP to check kidney function - Distrubution of pain is not likely kidney related and lack of any urinary symptoms also not like urinary related - Recommended stretching exercises before bed, such as child's pose. - Consider use of muscle relaxers or TENS unit if pain persists.  General health maintenance Mammogram normal will attempt to get copy to scan into our EMR. Ordered screening labs today.  - Continue routine health maintenance screenings.     Return in about 6 months (around 09/23/2024).    Tyniya Kuyper, PA-C

## 2024-03-26 ENCOUNTER — Encounter: Payer: Self-pay | Admitting: Physician Assistant

## 2024-03-26 ENCOUNTER — Ambulatory Visit: Payer: Self-pay | Admitting: Physician Assistant

## 2024-03-26 LAB — CMP14+EGFR
ALT: 22 IU/L (ref 0–32)
AST: 21 IU/L (ref 0–40)
Albumin: 3.9 g/dL (ref 3.9–4.9)
Alkaline Phosphatase: 115 IU/L (ref 41–116)
BUN/Creatinine Ratio: 13 (ref 9–23)
BUN: 10 mg/dL (ref 6–24)
Bilirubin Total: 0.5 mg/dL (ref 0.0–1.2)
CO2: 23 mmol/L (ref 20–29)
Calcium: 9.1 mg/dL (ref 8.7–10.2)
Chloride: 102 mmol/L (ref 96–106)
Creatinine, Ser: 0.78 mg/dL (ref 0.57–1.00)
Globulin, Total: 2.7 g/dL (ref 1.5–4.5)
Glucose: 78 mg/dL (ref 70–99)
Potassium: 4.8 mmol/L (ref 3.5–5.2)
Sodium: 138 mmol/L (ref 134–144)
Total Protein: 6.6 g/dL (ref 6.0–8.5)
eGFR: 96 mL/min/1.73 (ref >59)

## 2024-03-26 LAB — CBC WITH DIFFERENTIAL/PLATELET
Basophils Absolute: 0 x10E3/uL (ref 0.0–0.2)
Basos: 1 %
EOS (ABSOLUTE): 0 x10E3/uL (ref 0.0–0.4)
Eos: 0 %
Hematocrit: 44.3 % (ref 34.0–46.6)
Hemoglobin: 14.1 g/dL (ref 11.1–15.9)
Immature Grans (Abs): 0 x10E3/uL (ref 0.0–0.1)
Immature Granulocytes: 0 %
Lymphocytes Absolute: 2 x10E3/uL (ref 0.7–3.1)
Lymphs: 25 %
MCH: 29.5 pg (ref 26.6–33.0)
MCHC: 31.8 g/dL (ref 31.5–35.7)
MCV: 93 fL (ref 79–97)
Monocytes Absolute: 0.7 x10E3/uL (ref 0.1–0.9)
Monocytes: 8 %
Neutrophils Absolute: 5.3 x10E3/uL (ref 1.4–7.0)
Neutrophils: 66 %
Platelets: 334 x10E3/uL (ref 150–450)
RBC: 4.78 x10E6/uL (ref 3.77–5.28)
RDW: 13.6 % (ref 11.7–15.4)
WBC: 8.1 x10E3/uL (ref 3.4–10.8)

## 2024-03-26 LAB — VITAMIN D 25 HYDROXY (VIT D DEFICIENCY, FRACTURES): Vit D, 25-Hydroxy: 36.7 ng/mL (ref 30.0–100.0)

## 2024-03-26 NOTE — Progress Notes (Signed)
 [place],   Vitamin D  MUCH better.  Kidney, liver, glucose look great!

## 2024-04-06 ENCOUNTER — Encounter: Payer: Self-pay | Admitting: Physician Assistant

## 2024-04-06 DIAGNOSIS — M549 Dorsalgia, unspecified: Secondary | ICD-10-CM

## 2024-04-06 DIAGNOSIS — R10A1 Flank pain, right side: Secondary | ICD-10-CM

## 2024-04-16 NOTE — Telephone Encounter (Signed)
 Ok for external order for renal u/s for flank pain.

## 2024-04-17 NOTE — Telephone Encounter (Signed)
 Faxed order to [facility]  at fax # [phone]

## 2024-09-23 ENCOUNTER — Ambulatory Visit: Payer: PRIVATE HEALTH INSURANCE | Admitting: Physician Assistant
